# Patient Record
Sex: Female | Born: 1965 | Race: White | Hispanic: No | Marital: Married | Smoking: Current some day smoker
Health system: Southern US, Community
[De-identification: ages and names within clinical notes are randomized; demographics above are authoritative.]

## PROBLEM LIST (undated history)

## (undated) DIAGNOSIS — L409 Psoriasis, unspecified: Secondary | ICD-10-CM

## (undated) DIAGNOSIS — E049 Nontoxic goiter, unspecified: Secondary | ICD-10-CM

## (undated) DIAGNOSIS — T4145XA Adverse effect of unspecified anesthetic, initial encounter: Secondary | ICD-10-CM

## (undated) DIAGNOSIS — T8859XA Other complications of anesthesia, initial encounter: Secondary | ICD-10-CM

## (undated) DIAGNOSIS — E079 Disorder of thyroid, unspecified: Secondary | ICD-10-CM

## (undated) DIAGNOSIS — E039 Hypothyroidism, unspecified: Secondary | ICD-10-CM

## (undated) DIAGNOSIS — E559 Vitamin D deficiency, unspecified: Secondary | ICD-10-CM

## (undated) HISTORY — DX: Disorder of thyroid, unspecified: E07.9

## (undated) HISTORY — PX: BREAST ENHANCEMENT SURGERY: SHX7

## (undated) HISTORY — DX: Vitamin D deficiency, unspecified: E55.9

## (undated) HISTORY — DX: Psoriasis, unspecified: L40.9

## (undated) HISTORY — DX: Nontoxic goiter, unspecified: E04.9

---

## 2004-11-07 ENCOUNTER — Other Ambulatory Visit: Admission: RE | Admit: 2004-11-07 | Discharge: 2004-11-07 | Payer: Self-pay | Admitting: Obstetrics & Gynecology

## 2005-11-09 ENCOUNTER — Other Ambulatory Visit: Admission: RE | Admit: 2005-11-09 | Discharge: 2005-11-09 | Payer: Self-pay | Admitting: Family Medicine

## 2008-08-26 ENCOUNTER — Encounter: Admission: RE | Admit: 2008-08-26 | Discharge: 2008-08-26 | Payer: Self-pay | Admitting: Obstetrics and Gynecology

## 2010-11-28 ENCOUNTER — Other Ambulatory Visit: Payer: Self-pay | Admitting: Obstetrics and Gynecology

## 2010-11-28 DIAGNOSIS — Z1231 Encounter for screening mammogram for malignant neoplasm of breast: Secondary | ICD-10-CM

## 2010-11-28 DIAGNOSIS — Z803 Family history of malignant neoplasm of breast: Secondary | ICD-10-CM

## 2010-12-14 ENCOUNTER — Ambulatory Visit
Admission: RE | Admit: 2010-12-14 | Discharge: 2010-12-14 | Disposition: A | Discharge status: Home / Self Care | Payer: Managed Care, Other (non HMO) | Source: Ambulatory Visit | Attending: Obstetrics and Gynecology | Admitting: Obstetrics and Gynecology

## 2010-12-14 DIAGNOSIS — Z803 Family history of malignant neoplasm of breast: Secondary | ICD-10-CM

## 2010-12-14 DIAGNOSIS — Z1231 Encounter for screening mammogram for malignant neoplasm of breast: Secondary | ICD-10-CM

## 2012-11-11 ENCOUNTER — Other Ambulatory Visit: Payer: Self-pay

## 2012-11-11 DIAGNOSIS — Z1231 Encounter for screening mammogram for malignant neoplasm of breast: Secondary | ICD-10-CM

## 2012-12-26 ENCOUNTER — Ambulatory Visit
Admission: RE | Admit: 2012-12-26 | Discharge: 2012-12-26 | Disposition: A | Discharge status: Home / Self Care | Payer: Managed Care, Other (non HMO) | Source: Ambulatory Visit

## 2012-12-26 DIAGNOSIS — Z1231 Encounter for screening mammogram for malignant neoplasm of breast: Secondary | ICD-10-CM

## 2014-04-06 ENCOUNTER — Other Ambulatory Visit: Payer: Self-pay

## 2014-04-06 DIAGNOSIS — Z1231 Encounter for screening mammogram for malignant neoplasm of breast: Secondary | ICD-10-CM

## 2014-05-04 ENCOUNTER — Ambulatory Visit
Admission: RE | Admit: 2014-05-04 | Discharge: 2014-05-04 | Disposition: A | Discharge status: Home / Self Care | Payer: Managed Care, Other (non HMO) | Source: Ambulatory Visit

## 2014-05-04 ENCOUNTER — Other Ambulatory Visit: Payer: Self-pay

## 2014-05-04 DIAGNOSIS — Z1231 Encounter for screening mammogram for malignant neoplasm of breast: Secondary | ICD-10-CM

## 2015-07-04 ENCOUNTER — Encounter: Payer: Self-pay | Admitting: Internal Medicine

## 2015-07-18 ENCOUNTER — Ambulatory Visit: Payer: Managed Care, Other (non HMO) | Admitting: Internal Medicine

## 2015-08-01 ENCOUNTER — Other Ambulatory Visit: Payer: Self-pay

## 2015-08-01 DIAGNOSIS — Z1231 Encounter for screening mammogram for malignant neoplasm of breast: Secondary | ICD-10-CM

## 2015-08-16 ENCOUNTER — Encounter: Payer: Self-pay | Admitting: Internal Medicine

## 2015-08-16 ENCOUNTER — Ambulatory Visit (INDEPENDENT_AMBULATORY_CARE_PROVIDER_SITE_OTHER): Payer: Managed Care, Other (non HMO) | Admitting: Internal Medicine

## 2015-08-16 VITALS — BP 132/80 | HR 82 | Temp 98.6°F | Resp 12 | Ht 63.5 in | Wt 159.0 lb

## 2015-08-16 DIAGNOSIS — E559 Vitamin D deficiency, unspecified: Secondary | ICD-10-CM | POA: Diagnosis not present

## 2015-08-16 DIAGNOSIS — E040 Nontoxic diffuse goiter: Secondary | ICD-10-CM | POA: Diagnosis not present

## 2015-08-16 DIAGNOSIS — E038 Other specified hypothyroidism: Secondary | ICD-10-CM

## 2015-08-16 DIAGNOSIS — E063 Autoimmune thyroiditis: Secondary | ICD-10-CM | POA: Insufficient documentation

## 2015-08-16 LAB — TSH: TSH: 1.07 u[IU]/mL (ref 0.35–4.50)

## 2015-08-16 LAB — VITAMIN D 25 HYDROXY (VIT D DEFICIENCY, FRACTURES): VITD: 26.8 ng/mL — AB (ref 30.00–100.00)

## 2015-08-16 LAB — T4, FREE: FREE T4: 1.22 ng/dL (ref 0.60–1.60)

## 2015-08-16 MED ORDER — LEVOTHYROXINE SODIUM 125 MCG PO TABS: 125.0000 ug | ORAL_TABLET | Freq: Every day | ORAL | Status: DC

## 2015-08-16 NOTE — Patient Instructions (Signed)
Please stop at the lab.  Please continue the vitamin D 2000 units daily >> try to take it every day.  Continue Levothyroxine 125 mcg daily.  Take the thyroid hormone every day, with water, at least 30 minutes before breakfast, separated by at least 4 hours from: - acid reflux medications - calcium - iron - multivitamins  Please come back for a follow-up appointment in 6 months.

## 2015-08-16 NOTE — Progress Notes (Signed)
Patient ID: Leah Lin, female   DOB: Sep 03, 1965, 50 y.o.   MRN: [id number]   HPI  Leah Lin is a 50 y.o.-year-old female, self-referred for management of vitamin D deficiency and Hashimoto's hypothyroidism. She was previously followed by Dr. Ester Rink, endocrinologist at [facility].  Patient was diagnosed with vitamin D deficiency in ~2010.  Reviewed levels: 08/17/2013: Vitamin D 20.1 10/20/2012: Vitamin D 21.6 09/08/2012: Vitamin D 16.0 09/11/2011: Vitamin D 13.7  The records from Dr. Kalman Shan, patient had negative blood celiac workup in 08/2012.  She was intermittently compliant with her Vit D supplements in the past. Now on vitamin D3 2000 units - was recently off - now 4/7 days.   No kidney stones hx.  Pt. has been dx with hypothyroidism in 2010; this proved to he 2/2 Hashimoto's thyroiditis  She is on Levothyroxine 125 mcg, taken: - fasting - with water - no b'fast  - + coffee + creamer <30 min after the LT4 - no calcium, iron, PPIs, multivitamins   I reviewed pt's thyroid tests - per records from Dr Kalman Shan: 08/17/2013: TSH 1.300 10/20/2012: TSH 0.637 09/08/2012: TSH 0.210 09/11/2011: TSH 0.945 03/05/2011: TSH 1.170 06/16/2010: TSH 1.640  Pt describes: - + weight gain in the past, now stable. Previous UFC normal in 2011. - + fatigue - No cold intolerance, + hot flushes - no depression, anxiety - + constipation - no dry skin - no hair loss  Pt denies feeling nodules in neck, hoarseness, dysphagia/odynophagia, SOB with lying down.  She has a h/o non-nodular goiter. I reviewed her latest thyroid ultrasound report from 10/13/2011: Right lobe: 5.6 x 2.0 x 1.9 cm  Left lobe: 5.0 x 1.7 x 1.6 cm.  Both lobes are relatively hypervascular and heterogeneous in appearance, similar to previous exam (2011). No new discrete nodule suggested.   She has + FH of thyroid disorders in: father and sister (hypothroidism). No FH of thyroid cancer.  No h/o radiation tx  to head or neck. No recent use of iodine supplements. No Biotin.  + skin psoriasis.   ROS: Constitutional: see HPI Eyes: + blurry vision with HAs, no xerophthalmia ENT: no sore throat, no nodules palpated in throat, no dysphagia/odynophagia, no hoarseness Cardiovascular: no CP/SOB/palpitations/leg swelling Respiratory: no cough/SOB Gastrointestinal: no N/V/D/+ C Musculoskeletal: no muscle/joint aches Skin: no rashes Neurological: no tremors/numbness/tingling/dizziness Psychiatric: no depression/anxiety + low libido  Past Medical History  Diagnosis Date  . Thyroid disease     Hashimoto's thyroiditis  . Vitamin D deficiency   . Psoriasis   . Goiter    No past surgical history on file. Social History   Social History  . Marital Status: Married    Spouse Name: N/A  . Number of Children: 2   Occupational History  . homemaker   Social History Main Topics  . Smoking status: Former Research scientist (life sciences), quit in 2013  . Smokeless tobacco: Not on file  . Alcohol Use: Wine, liquor, 3-4x a week  . Drug Use: No   Current Outpatient Prescriptions on File Prior to Visit  Medication Sig Dispense Refill  . levothyroxine (SYNTHROID, LEVOTHROID) 125 MCG tablet Take 125 mcg by mouth. TAKE ONE TABLET DAILY, Sunday - Friday AND 1/2 TABLET ON Saturday, ON AN EMPTY STOMACH.     No current facility-administered medications on file prior to visit.   Allergies  Allergen Reactions  . Tetracyclines & Related Swelling  . Amoxicillin Rash   Family History  Problem Relation Age of Onset  . Hypertension  Mother   . Cancer Mother   . Diabetes Father   . Hypertension Father   . Thyroid disease Father   . Diabetes Sister   . Thyroid disease Sister   . Cancer Sister    PE: BP 132/80 mmHg  Pulse 82  Temp(Src) 98.6 F (37 C) (Oral)  Resp 12  Ht 5' 3.5" (1.613 m)  Wt 159 lb (72.122 kg)  BMI 27.72 kg/m2  SpO2 99% Wt Readings from Last 3 Encounters:  08/16/15 159 lb (72.122 kg)    Constitutional: overweight, in NAD Eyes: PERRLA, EOMI, no exophthalmos ENT: moist mucous membranes, slight symmetric thyromegaly, no cervical lymphadenopathy Cardiovascular: RRR, No MRG Respiratory: CTA B Gastrointestinal: abdomen soft, NT, ND, BS+ Musculoskeletal: no deformities, strength intact in all 4 Skin: moist, warm, no rashes Neurological: no tremor with outstretched hands, DTR normal in all 4  ASSESSMENT: 1. Hashimoto's Hypothyroidism  2. Vit D deficiency  3 goiter.   PLAN:  1. Patient with long-standing hypothyroidism, on levothyroxine therapy. She appears euthyroid. She does appear to have a goiter, but no thyroid nodules, or neck compression symptoms. - We discussed about correct intake of levothyroxine, fasting, with water, separated by at least 30 minutes from breakfast, and separated by more than 4 hours from calcium, iron, multivitamins, acid reflux medications (PPIs). She is taking this correctly, however, she is drinking coffee with creamer less than 30 minutes after taking levothyroxine. I advised her to move this at least 30 minutes after. - will check thyroid tests today: TSH, free T4 - If these are abnormal, she will need to return in 6 weeks for repeat labs - If these are normal, I will see her back in 6 months  2.  Vit D deficiency - Patient with history of vitamin D deficiency, with no checks in the last 2 years - She is taking vitamin D3 2000 units daily, however, she skips many doses, so that she ends up taking it approximately 3 times a week - Advised her to start taking it every day, but will recheck level today and she may need to restart high-dose ergocalciferol weekly.  3. Goiter - Non-compressing, Non-toxic - Non-nodular per the last ultrasound report from 2013. No further repeat ultrasounds needed.  Needs refill of levothyroxine when labs are back.  CC: [place]  Corrington, Delsa Grana., MD  Doyle Askew  PA-C    Office Visit on 08/16/2015  Component Date Value Ref Range Status  . TSH 08/16/2015 1.07  0.35 - 4.50 uIU/mL Final  . Free T4 08/16/2015 1.22  0.60 - 1.60 ng/dL Final  . VITD 08/16/2015 26.80* 30.00 - 100.00 ng/mL Final   TFTs normal >> will continue the same dose of levothyroxine for now. Vitamin D slightly low >> will advise to continue with the 2000 units of vitamin D3, but she needs to remember to take it daily. I will recheck this at next visit.

## 2015-08-17 LAB — THYROID PEROXIDASE ANTIBODY: Thyroperoxidase Ab SerPl-aCnc: >900 IU/mL — ABNORMAL HIGH (ref <9)

## 2015-08-30 ENCOUNTER — Ambulatory Visit: Payer: Managed Care, Other (non HMO)

## 2015-12-06 NOTE — Patient Instructions (Signed)
Your procedure is scheduled on:  Tuesday, December 20, 2015  Enter through the [facility] at:  6:00 AM  Pick up the phone at the desk and dial [phone].  Call this number if you have problems the morning of surgery: [phone].  Remember: Do NOT eat food or drink after:  Midnight Monday, December 19, 2015  Take these medicines the morning of surgery with a SIP OF WATER:  Levothyroxine  Do NOT wear jewelry (body piercing), metal hair clips/bobby pins, make-up, or nail polish. Do NOT wear lotions, powders, or perfumes.  You may wear deodorant. Do NOT shave for 48 hours prior to surgery. Do NOT bring valuables to the hospital. Contacts, dentures, or bridgework may not be worn into surgery.  Leave suitcase in car.  After surgery it may be brought to your room.  For patients admitted to the hospital, checkout time is 11:00 AM the day of discharge.

## 2015-12-07 ENCOUNTER — Encounter (HOSPITAL_COMMUNITY)
Admission: RE | Admit: 2015-12-07 | Discharge: 2015-12-07 | Disposition: A | Discharge status: Home / Self Care | Payer: Managed Care, Other (non HMO) | Source: Ambulatory Visit | Attending: Obstetrics and Gynecology | Admitting: Obstetrics and Gynecology

## 2015-12-07 ENCOUNTER — Other Ambulatory Visit: Payer: Self-pay | Admitting: Obstetrics and Gynecology

## 2015-12-07 ENCOUNTER — Encounter (HOSPITAL_COMMUNITY): Payer: Self-pay

## 2015-12-07 DIAGNOSIS — Z01812 Encounter for preprocedural laboratory examination: Secondary | ICD-10-CM | POA: Insufficient documentation

## 2015-12-07 HISTORY — DX: Adverse effect of unspecified anesthetic, initial encounter: T41.45XA

## 2015-12-07 HISTORY — DX: Other complications of anesthesia, initial encounter: T88.59XA

## 2015-12-07 HISTORY — DX: Hypothyroidism, unspecified: E03.9

## 2015-12-07 LAB — CBC
HCT: 40.4 % (ref 36.0–46.0)
HEMOGLOBIN: 13.8 g/dL (ref 12.0–15.0)
MCH: 31.1 pg (ref 26.0–34.0)
MCHC: 34.2 g/dL (ref 30.0–36.0)
MCV: 91 fL (ref 78.0–100.0)
Platelets: 321 10*3/uL (ref 150–400)
RBC: 4.44 MIL/uL (ref 3.87–5.11)
RDW: 13.8 % (ref 11.5–15.5)
WBC: 4.8 10*3/uL (ref 4.0–10.5)

## 2015-12-07 LAB — ABO/RH: ABO/RH(D): B POS

## 2015-12-19 ENCOUNTER — Encounter (HOSPITAL_COMMUNITY): Payer: Self-pay | Admitting: Anesthesiology

## 2015-12-19 MED ORDER — CIPROFLOXACIN IN D5W 400 MG/200ML IV SOLN
400.0000 mg | INTRAVENOUS | Status: AC
  Administered 2015-12-20: 400 mg via INTRAVENOUS
  Filled 2015-12-19: qty 200

## 2015-12-19 MED ORDER — METRONIDAZOLE IN NACL 5-0.79 MG/ML-% IV SOLN
500.0000 mg | INTRAVENOUS | Status: AC
  Administered 2015-12-20: 500 mg via INTRAVENOUS
  Filled 2015-12-19: qty 100

## 2015-12-19 NOTE — H&P (Signed)
50 y.o. yo complains of uterine prolapse, SUI mild, cystocele and rectocele.  She is also having irritation of the vulva that did not get better with desitin.  Patient is not on Hormone Replacement Therapy., She reports menopausal symptoms.  No bleeding at all. Some irritation on outside. No more periods for 9 months. Some pmp sx but not bad. Now prolapse feels like she needs to push back up. No interest in SA since it bothers her that it is there. Feels like something right at entrance. Although her SUI is mild, it may worsen significantly with A repair since her cystocele is large and she had urethral hypermobility. She also c/o vaginal d/c, itching and irritation, did not try Monistat because that caused burning.  She used desitin and metrogel but vulvar AWL is still there.   Past Medical History:  Diagnosis Date  . Complication of anesthesia    hard time waking up after anesthesia  . Goiter   . Hypothyroidism   . Psoriasis   . Thyroid disease    Hashimoto's thyroiditis  . Vitamin D deficiency    Past Surgical History:  Procedure Laterality Date  . BREAST ENHANCEMENT SURGERY      Social History   Social History  . Marital status: Married    Spouse name: N/A  . Number of children: N/A  . Years of education: N/A   Occupational History  . Not on file.   Social History Main Topics  . Smoking status: Current Some Day Smoker    Packs/day: 1.00    Years: 20.00    Types: E-cigarettes, Cigarettes  . Smokeless tobacco: Never Used  . Alcohol use 0.0 oz/week  . Drug use: No  . Sexual activity: Yes    Birth control/ protection: Post-menopausal   Other Topics Concern  . Not on file   Social History Narrative  . No narrative on file    No current facility-administered medications on file prior to encounter.    Current Outpatient Prescriptions on File Prior to Encounter  Medication Sig Dispense Refill  . Cholecalciferol (VITAMIN D3 SUPER STRENGTH) 2000 units CAPS Take 1 capsule  by mouth daily.    Marland Kitchen levothyroxine (SYNTHROID, LEVOTHROID) 125 MCG tablet Take 1 tablet (125 mcg total) by mouth daily before breakfast. 90 tablet 3    Allergies  Allergen Reactions  . Tetracyclines & Related Swelling  . Amoxicillin Rash    Has patient had a PCN reaction causing immediate rash, facial/tongue/throat swelling, SOB or lightheadedness with hypotension: No Has patient had a PCN reaction causing severe rash involving mucus membranes or skin necrosis: No Has patient had a PCN reaction that required hospitalization No Has patient had a PCN reaction occurring within the last 10 years: No If all of the above answers are "NO", then may proceed with Cephalosporin use.     @VITALS2 @  Lungs: clear to ascultation Cor:  RRR Abdomen:  soft, nontender, nondistended. 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  Patient  will receive preop antibiotics and SCDs during the operation.     Samira Acero A

## 2015-12-20 ENCOUNTER — Ambulatory Visit (HOSPITAL_COMMUNITY): Payer: Managed Care, Other (non HMO) | Admitting: Anesthesiology

## 2015-12-20 ENCOUNTER — Encounter (HOSPITAL_COMMUNITY)
Admission: RE | Disposition: A | Discharge status: Home / Self Care | Payer: Self-pay | Source: Ambulatory Visit | Attending: Obstetrics and Gynecology

## 2015-12-20 ENCOUNTER — Encounter (HOSPITAL_COMMUNITY): Payer: Self-pay | Admitting: *Deleted

## 2015-12-20 ENCOUNTER — Ambulatory Visit (HOSPITAL_COMMUNITY)
Admission: RE | Admit: 2015-12-20 | Discharge: 2015-12-22 | Disposition: A | Discharge status: Home / Self Care | Payer: Managed Care, Other (non HMO) | Source: Ambulatory Visit | Attending: Obstetrics and Gynecology | Admitting: Obstetrics and Gynecology

## 2015-12-20 DIAGNOSIS — N99821 Postprocedural hemorrhage and hematoma of a genitourinary system organ or structure following other procedure: Secondary | ICD-10-CM | POA: Insufficient documentation

## 2015-12-20 DIAGNOSIS — N814 Uterovaginal prolapse, unspecified: Secondary | ICD-10-CM | POA: Diagnosis not present

## 2015-12-20 DIAGNOSIS — E063 Autoimmune thyroiditis: Secondary | ICD-10-CM | POA: Diagnosis not present

## 2015-12-20 DIAGNOSIS — N393 Stress incontinence (female) (male): Secondary | ICD-10-CM | POA: Diagnosis not present

## 2015-12-20 DIAGNOSIS — N800 Endometriosis of uterus: Secondary | ICD-10-CM | POA: Insufficient documentation

## 2015-12-20 DIAGNOSIS — F17210 Nicotine dependence, cigarettes, uncomplicated: Secondary | ICD-10-CM | POA: Diagnosis not present

## 2015-12-20 DIAGNOSIS — L900 Lichen sclerosus et atrophicus: Secondary | ICD-10-CM | POA: Insufficient documentation

## 2015-12-20 DIAGNOSIS — Z880 Allergy status to penicillin: Secondary | ICD-10-CM | POA: Diagnosis not present

## 2015-12-20 DIAGNOSIS — Z9889 Other specified postprocedural states: Secondary | ICD-10-CM

## 2015-12-20 DIAGNOSIS — D251 Intramural leiomyoma of uterus: Secondary | ICD-10-CM | POA: Diagnosis not present

## 2015-12-20 DIAGNOSIS — Z09 Encounter for follow-up examination after completed treatment for conditions other than malignant neoplasm: Secondary | ICD-10-CM

## 2015-12-20 HISTORY — PX: ANTERIOR AND POSTERIOR REPAIR: SHX5121

## 2015-12-20 HISTORY — PX: BLADDER SUSPENSION: SHX72

## 2015-12-20 HISTORY — PX: CYSTOSCOPY: SHX5120

## 2015-12-20 HISTORY — PX: VAGINAL HYSTERECTOMY: SHX2639

## 2015-12-20 LAB — PREGNANCY, URINE: Preg Test, Ur: NEGATIVE

## 2015-12-20 SURGERY — HYSTERECTOMY, VAGINAL
Anesthesia: General | Site: Vagina

## 2015-12-20 MED ORDER — MENTHOL 3 MG MT LOZG
1.0000 | LOZENGE | OROMUCOSAL | Status: DC | PRN
  Administered 2015-12-20: 3 mg via ORAL
  Filled 2015-12-20: qty 9

## 2015-12-20 MED ORDER — ROCURONIUM BROMIDE 100 MG/10ML IV SOLN
INTRAVENOUS | Status: AC
  Filled 2015-12-20: qty 1

## 2015-12-20 MED ORDER — MIDAZOLAM HCL 2 MG/2ML IJ SOLN
INTRAMUSCULAR | Status: DC | PRN
  Administered 2015-12-20: 2 mg via INTRAVENOUS

## 2015-12-20 MED ORDER — ESTRADIOL 0.1 MG/GM VA CREA
TOPICAL_CREAM | VAGINAL | Status: AC
  Filled 2015-12-20: qty 42.5

## 2015-12-20 MED ORDER — ESTRADIOL 0.1 MG/GM VA CREA
TOPICAL_CREAM | VAGINAL | Status: DC | PRN
  Administered 2015-12-20: 1 via VAGINAL

## 2015-12-20 MED ORDER — LIDOCAINE HCL (CARDIAC) 20 MG/ML IV SOLN
INTRAVENOUS | Status: DC | PRN
  Administered 2015-12-20: 80 mg via INTRAVENOUS

## 2015-12-20 MED ORDER — OXYCODONE-ACETAMINOPHEN 5-325 MG PO TABS
1.0000 | ORAL_TABLET | ORAL | Status: DC | PRN
  Administered 2015-12-20 – 2015-12-21 (×4): 2 via ORAL
  Filled 2015-12-20 (×4): qty 2

## 2015-12-20 MED ORDER — SCOPOLAMINE 1 MG/3DAYS TD PT72
MEDICATED_PATCH | TRANSDERMAL | Status: AC
  Administered 2015-12-20: 1.5 mg via TRANSDERMAL
  Filled 2015-12-20: qty 1

## 2015-12-20 MED ORDER — HYDROMORPHONE HCL 1 MG/ML IJ SOLN
INTRAMUSCULAR | Status: AC
  Filled 2015-12-20: qty 1

## 2015-12-20 MED ORDER — LIDOCAINE-EPINEPHRINE (PF) 1 %-1:200000 IJ SOLN
INTRAMUSCULAR | Status: AC
  Filled 2015-12-20: qty 30

## 2015-12-20 MED ORDER — PROPOFOL 10 MG/ML IV BOLUS
INTRAVENOUS | Status: DC | PRN
  Administered 2015-12-20: 200 mg via INTRAVENOUS
  Administered 2015-12-20: 50 mg via INTRAVENOUS

## 2015-12-20 MED ORDER — KETOROLAC TROMETHAMINE 30 MG/ML IJ SOLN: 30.0000 mg | Freq: Once | INTRAMUSCULAR | Status: DC

## 2015-12-20 MED ORDER — KETOROLAC TROMETHAMINE 30 MG/ML IJ SOLN
INTRAMUSCULAR | Status: AC
  Filled 2015-12-20: qty 1

## 2015-12-20 MED ORDER — LIDOCAINE-EPINEPHRINE 0.5 %-1:200000 IJ SOLN
INTRAMUSCULAR | Status: DC | PRN
  Administered 2015-12-20: 23 mL

## 2015-12-20 MED ORDER — FENTANYL CITRATE (PF) 100 MCG/2ML IJ SOLN
INTRAMUSCULAR | Status: AC
  Filled 2015-12-20: qty 2

## 2015-12-20 MED ORDER — ONDANSETRON HCL 4 MG/2ML IJ SOLN
INTRAMUSCULAR | Status: DC | PRN
  Administered 2015-12-20: 4 mg via INTRAVENOUS

## 2015-12-20 MED ORDER — GELATIN ABSORBABLE 12-7 MM EX MISC
CUTANEOUS | Status: DC | PRN
  Administered 2015-12-20: 1

## 2015-12-20 MED ORDER — DEXAMETHASONE SODIUM PHOSPHATE 4 MG/ML IJ SOLN
INTRAMUSCULAR | Status: AC
  Filled 2015-12-20: qty 1

## 2015-12-20 MED ORDER — PROPOFOL 10 MG/ML IV BOLUS
INTRAVENOUS | Status: AC
  Filled 2015-12-20: qty 20

## 2015-12-20 MED ORDER — HYDROMORPHONE HCL 1 MG/ML IJ SOLN
INTRAMUSCULAR | Status: DC | PRN
  Administered 2015-12-20: 0.5 mg via INTRAVENOUS
  Administered 2015-12-20: 1 mg via INTRAVENOUS
  Administered 2015-12-20: 0.5 mg via INTRAVENOUS

## 2015-12-20 MED ORDER — LIDOCAINE-EPINEPHRINE 0.5 %-1:200000 IJ SOLN
INTRAMUSCULAR | Status: AC
  Filled 2015-12-20: qty 1

## 2015-12-20 MED ORDER — LACTATED RINGERS IV SOLN
INTRAVENOUS | Status: DC
Start: 2015-12-20 — End: 2015-12-20
  Administered 2015-12-20 (×3): via INTRAVENOUS

## 2015-12-20 MED ORDER — ONDANSETRON HCL 4 MG PO TABS: 4.0000 mg | ORAL_TABLET | Freq: Four times a day (QID) | ORAL | Status: DC | PRN

## 2015-12-20 MED ORDER — IBUPROFEN 800 MG PO TABS
800.0000 mg | ORAL_TABLET | Freq: Three times a day (TID) | ORAL | Status: DC | PRN
  Administered 2015-12-21: 800 mg via ORAL
  Filled 2015-12-20: qty 1

## 2015-12-20 MED ORDER — LIDOCAINE HCL (CARDIAC) 20 MG/ML IV SOLN
INTRAVENOUS | Status: AC
  Filled 2015-12-20: qty 5

## 2015-12-20 MED ORDER — HYDROMORPHONE HCL 1 MG/ML IJ SOLN
0.2500 mg | INTRAMUSCULAR | Status: DC | PRN
Start: 2015-12-20 — End: 2015-12-20
  Administered 2015-12-20: 0.25 mg via INTRAVENOUS

## 2015-12-20 MED ORDER — SCOPOLAMINE 1 MG/3DAYS TD PT72
1.0000 | MEDICATED_PATCH | Freq: Once | TRANSDERMAL | Status: DC
  Administered 2015-12-20: 1.5 mg via TRANSDERMAL

## 2015-12-20 MED ORDER — FENTANYL CITRATE (PF) 100 MCG/2ML IJ SOLN
INTRAMUSCULAR | Status: DC | PRN
  Administered 2015-12-20 (×2): 50 ug via INTRAVENOUS
  Administered 2015-12-20: 100 ug via INTRAVENOUS
  Administered 2015-12-20: 50 ug via INTRAVENOUS

## 2015-12-20 MED ORDER — STERILE WATER FOR IRRIGATION IR SOLN
Status: DC | PRN
  Administered 2015-12-20: 1000 mL

## 2015-12-20 MED ORDER — PROMETHAZINE HCL 25 MG/ML IJ SOLN
6.2500 mg | INTRAMUSCULAR | Status: DC | PRN
Start: 2015-12-20 — End: 2015-12-20

## 2015-12-20 MED ORDER — KETOROLAC TROMETHAMINE 30 MG/ML IJ SOLN: 30.0000 mg | Freq: Four times a day (QID) | INTRAMUSCULAR | Status: DC

## 2015-12-20 MED ORDER — LEVOTHYROXINE SODIUM 125 MCG PO TABS
125.0000 ug | ORAL_TABLET | Freq: Every day | ORAL | Status: DC
  Administered 2015-12-21: 125 ug via ORAL
  Filled 2015-12-20 (×2): qty 1

## 2015-12-20 MED ORDER — FENTANYL CITRATE (PF) 250 MCG/5ML IJ SOLN
INTRAMUSCULAR | Status: AC
  Filled 2015-12-20: qty 5

## 2015-12-20 MED ORDER — MIDAZOLAM HCL 2 MG/2ML IJ SOLN
INTRAMUSCULAR | Status: AC
  Filled 2015-12-20: qty 2

## 2015-12-20 MED ORDER — ONDANSETRON HCL 4 MG/2ML IJ SOLN
INTRAMUSCULAR | Status: AC
  Filled 2015-12-20: qty 2

## 2015-12-20 MED ORDER — DEXAMETHASONE SODIUM PHOSPHATE 10 MG/ML IJ SOLN
INTRAMUSCULAR | Status: DC | PRN
  Administered 2015-12-20: 4 mg via INTRAVENOUS

## 2015-12-20 MED ORDER — ONDANSETRON HCL 4 MG/2ML IJ SOLN: 4.0000 mg | Freq: Four times a day (QID) | INTRAMUSCULAR | Status: DC | PRN

## 2015-12-20 MED ORDER — KETOROLAC TROMETHAMINE 30 MG/ML IJ SOLN
INTRAMUSCULAR | Status: DC | PRN
  Administered 2015-12-20: 30 mg via INTRAVENOUS

## 2015-12-20 MED ORDER — MEPERIDINE HCL 25 MG/ML IJ SOLN: 6.2500 mg | INTRAMUSCULAR | Status: DC | PRN

## 2015-12-20 SURGICAL SUPPLY — 37 items
CANISTER SUCT 3000ML (MISCELLANEOUS) ×5 IMPLANT
CATH ROBINSON RED A/P 16FR (CATHETERS) ×5 IMPLANT
CLOTH BEACON ORANGE TIMEOUT ST (SAFETY) ×5 IMPLANT
CONT PATH 16OZ SNAP LID 3702 (MISCELLANEOUS) ×2 IMPLANT
DECANTER SPIKE VIAL GLASS SM (MISCELLANEOUS) IMPLANT
FORMULA 20CAL 3 OZ MEAD (FORMULA) ×3 IMPLANT
GAUZE PACKING 1 X5 YD ST (GAUZE/BANDAGES/DRESSINGS) ×3 IMPLANT
GAUZE PACKING 2X5 YD STRL (GAUZE/BANDAGES/DRESSINGS) ×3 IMPLANT
GLOVE BIO SURGEON STRL SZ7 (GLOVE) ×5 IMPLANT
GLOVE BIOGEL PI IND STRL 6.5 (GLOVE) ×3 IMPLANT
GLOVE BIOGEL PI IND STRL 7.0 (GLOVE) ×3 IMPLANT
GLOVE BIOGEL PI INDICATOR 6.5 (GLOVE) ×2
GLOVE BIOGEL PI INDICATOR 7.0 (GLOVE) ×2
GOWN STRL REUS W/TWL LRG LVL3 (GOWN DISPOSABLE) ×20 IMPLANT
LIQUID BAND (GAUZE/BANDAGES/DRESSINGS) ×3 IMPLANT
NDL SPNL 22GX3.5 QUINCKE BK (NEEDLE) IMPLANT
NEEDLE HYPO 22GX1.5 SAFETY (NEEDLE) ×5 IMPLANT
NEEDLE SPNL 22GX3.5 QUINCKE BK (NEEDLE) ×5 IMPLANT
NS IRRIG 1000ML POUR BTL (IV SOLUTION) ×5 IMPLANT
PACK VAGINAL WOMENS (CUSTOM PROCEDURE TRAY) ×5 IMPLANT
PAD OB MATERNITY 4.3X12.25 (PERSONAL CARE ITEMS) ×5 IMPLANT
SET CYSTO W/LG BORE CLAMP LF (SET/KITS/TRAYS/PACK) ×5 IMPLANT
SLING TRANS VAGINAL TAPE (Sling) ×2 IMPLANT
SLING UTERINE/ABD GYNECARE TVT (Sling) IMPLANT
SUT SILK 3 0 SH CR/8 (SUTURE) IMPLANT
SUT VIC AB 0 CT1 18XCR BRD8 (SUTURE) ×9 IMPLANT
SUT VIC AB 0 CT1 8-18 (SUTURE) ×15
SUT VIC AB 2-0 CT1 (SUTURE) ×10 IMPLANT
SUT VIC AB 2-0 CT1 27 (SUTURE) ×10
SUT VIC AB 2-0 CT1 TAPERPNT 27 (SUTURE) ×10 IMPLANT
SUT VIC AB 2-0 SH 27 (SUTURE) ×5
SUT VIC AB 2-0 SH 27XBRD (SUTURE) ×2 IMPLANT
SUT VICRYL 0 TIES 12 18 (SUTURE) ×5 IMPLANT
SYRINGE TOOMEY DISP (SYRINGE) ×2 IMPLANT
TOWEL OR 17X24 6PK STRL BLUE (TOWEL DISPOSABLE) ×10 IMPLANT
TRAY FOLEY CATH SILVER 14FR (SET/KITS/TRAYS/PACK) ×5 IMPLANT
WATER STERILE IRR 1000ML POUR (IV SOLUTION) ×3 IMPLANT

## 2015-12-20 NOTE — Anesthesia Postprocedure Evaluation (Signed)
Anesthesia Post Note  Patient: Leah Lin  Procedure(s) Performed: Procedure(s) (LRB): HYSTERECTOMY VAGINAL WITH RIGHT SALPINGECTOMY (N/A) ANTERIOR (CYSTOCELE) AND POSTERIOR REPAIR (RECTOCELE),PERITONEAL BIOPSY (N/A) CYSTOSCOPY (N/A) TRANSVAGINAL TAPE (TVT) PROCEDURE  Patient location during evaluation: PACU Anesthesia Type: General Level of consciousness: awake Pain management: pain level controlled Vital Signs Assessment: post-procedure vital signs reviewed and stable Respiratory status: spontaneous breathing Cardiovascular status: stable Postop Assessment: no signs of nausea or vomiting Anesthetic complications: no     Last Vitals:  Vitals:   12/20/15 1015 12/20/15 1030  BP: (!) 124/97 125/75  Pulse: 90 88  Resp: 15 13  Temp:      Last Pain:  Vitals:   12/20/15 1045  TempSrc:   PainSc: 2    Pain Goal: Patients Stated Pain Goal: 2 (12/20/15 0615)               [place]

## 2015-12-20 NOTE — Anesthesia Preprocedure Evaluation (Addendum)
Anesthesia Evaluation  Patient identified by MRN, date of birth, ID band  Reviewed: Allergy & Precautions, H&P , NPO status , Patient's Chart, lab work & pertinent test results, reviewed documented beta blocker date and time   Airway Mallampati: I  TM Distance: >3 FB Neck ROM: full    Dental  (+) Teeth Intact, Loose, Dental Advisory Given,    Pulmonary neg pulmonary ROS, Current Smoker,    Pulmonary exam normal        Cardiovascular negative cardio ROS Normal cardiovascular exam     Neuro/Psych negative neurological ROS  negative psych ROS   GI/Hepatic negative GI ROS, Neg liver ROS,   Endo/Other    Renal/GU negative Renal ROS     Musculoskeletal   Abdominal Normal abdominal exam  (+)   Peds  Hematology negative hematology ROS (+)   Anesthesia Other Findings   Reproductive/Obstetrics negative OB ROS                            Anesthesia Physical Anesthesia Plan  ASA: II  Anesthesia Plan: General   Post-op Pain Management:    Induction: Intravenous  Airway Management Planned: LMA  Additional Equipment:   Intra-op Plan:   Post-operative Plan:   Informed Consent: I have reviewed the patients History and Physical, chart, labs and discussed the procedure including the risks, benefits and alternatives for the proposed anesthesia with the patient or authorized representative who has indicated his/her understanding and acceptance.   Dental Advisory Given  Plan Discussed with: CRNA and Surgeon  Anesthesia Plan Comments:         Anesthesia Quick Evaluation

## 2015-12-20 NOTE — Op Note (Signed)
12/20/2015  9:28 AM  PATIENT:  Leah Lin  50 y.o. female  PRE-OPERATIVE DIAGNOSIS:  PROLAPSE, SUI, CYSTocele, Rectocele, vaginal AWL  POST-OPERATIVE DIAGNOSIS:  PROLAPSE, SUI, CYSTOCELE, same  PROCEDURE:  Procedure(s): HYSTERECTOMY VAGINAL (N/A) ANTERIOR (CYSTOCELE) AND POSTERIOR REPAIR (RECTOCELE) (N/A) CYSTOSCOPY (N/A)  SURGEON:  Surgeon(s) and Role:    * Bobbye Charleston, MD - Primary    * Alden Hipp, MD - Assisting  ANESTHESIA:   general  EBL:  Total I/O In: 2300 [I.V.:2300] Out: 150 [Blood:150]  LOCAL MEDICATIONS USED:  MARCAINE, gelfoam  SPECIMEN:  Source of Specimen:  uterus, cervix, R tube, perineal biopsy  DISPOSITION OF SPECIMEN:  PATHOLOGY  COUNTS:  YES  TOURNIQUET:  * No tourniquets in log *  DICTATION: .Note written in EPIC  PLAN OF CARE: Admit for overnight observation  PATIENT DISPOSITION:  PACU - hemodynamically stable.   Delay start of Pharmacological VTE agent (>24hrs) due to surgical blood loss or risk of bleeding: not applicable  Meds:  Similac in bladder, lidocaine with epi. Gel foam and estrace.  Findings: cystocele to 0, 7 week size uterus and normal ovaries.  Technique:  Meds:  Similac in bladder, lidocaine with epi, and estrace.  Findings: cystocele to 0, rectocel to -1, 7 week size uterus and normal ovaries.  Prolapse to -3.  Intact bladder and NO NEEDLES in bladder; good spill from bilateral ureteral orifaces.  Technique:  After general anesthesia was achieved, the patient was prepped and draped in a sterile fashion.  The bladder was emptied with a red rubber catheter and 60 cc of Similac was placed in the bladder. The cervix was grasped with a pair of Lahey clamps and injected circumferentially with 0.5% marcaine with epi. A circumferential incision was made around the cervix with the scalpel at the level of the reflection of the vagina onto the cervix and the posterior cul-de-sac was entered into with Mayo scissors. The long  billed duckbill retractor was then placed and the bladder was removed off the cervix carefully with sharp dissection with the Metzenbaums. The the uterosacrals were grasped with a pair heney clamps on either side and secured with a Heaney stitch of 0 Vicryl. Cardinal ligament was then divided with alternating successive bites of the Heaney clamp followed by incision with the Mayo scissors and secured with stitches of 0 Vicryl at the level of the cornua Heaneys were placed bilaterally around the entire pedicle and the uterus was able to be amputated.  The pedicles were secured with a free tie of 0 vicryl followed by a stitch of 0 vicryl.  The R tube was successively brought down with a babcock and removed with mayos and secured with a stitch.  The L tube was unable to be brought down at all and was left in place.  Once hemostasis was achieved the peritoneum was closed in a pursestring fashion including the bilateral uterosacrals and posteriorly in and out of the vagina and a partial Halbans culdoplasty.  The stitch was pulled taut closing the peritoneum and pulling the uterosacrals together through the modified Hall bands and through the vagina. The Foley was placed at this point.  The anterior vaginal mucosa was then entered into in the midline with the help of Alice's after being injected with 0.5% marcaine with epi with the scalpel. The vaginal mucosa was then reflected off of the vesicouterine fascia with careful sharp dissection with the Metzenbaums until the pelvic floor could feel be felt underneath the pubic bone. 2 small  stab incisions are made on 2 cm either side of the midline just above the pubic bone. The abdominal needles were placed carefully through the pelvic floor and out the vagina. The Foley was removed and the cystoscope placed inside the bladder; there were no needles in the bladder, there was good spillage of indigo carmine from the bilateral ureteral orifices and the trigone and urethra were  clear.  The foley was replaced. The abdominal needles were attached the vaginal needles and the tape pulled through and cut into place. A Claiborne Billings was used to ensure that the there was no tension placed on the tape. Once I was satisfied that the sling was in the correct place and at no tension, the anterior repair was done with 2 mattress stitches of 0 Vicryl. Hemostasis was achieved with gel foam and several stitches of 2-0 Vicryl to secure a pumper. The vaginal mucosa was trimmed and closed with a running locked stitch of 2-0 Vicryl.  Attention was then turned to the perineum which had some AWL just under entrance. The perineum was grasped with allises  And a triangle of tissue was removed and sent to path.  The midline was grasped all the way to the top of the rectocele with allises.  Marcaine was injected submucosally and then incised in the midline vertically.  The mucosa was then retracted off the rectovaginal fascia with sharp and blunt dissection.  The rectum remained intact throughout the procedure and was checked with a gloved finger several times.  Two mattress stitches were then used to close the rectovaginal fascia over the defect.  The vaginal mucosa was then trimmed and then closed with a running lock stitch of 2-0 vicryl.    The cuff was then closed with interrupted figure-of-eight stitches of 0 Vicryl. One additional look in the bladder with cysto was done to ensure that the extra stitches for hemostasis under the cystocele did not go through or inhibit ureters.  A vaginal pack was placed inside the vagina with Estrace cream and the patient tolerated the procedure was returned to recovery in stable condition.   Patrici Minnis A

## 2015-12-20 NOTE — Transfer of Care (Signed)
Immediate Anesthesia Transfer of Care Note  Patient: Leah Lin  Procedure(s) Performed: Procedure(s): HYSTERECTOMY VAGINAL WITH RIGHT SALPINGECTOMY (N/A) ANTERIOR (CYSTOCELE) AND POSTERIOR REPAIR (RECTOCELE),PERITONEAL BIOPSY (N/A) CYSTOSCOPY (N/A) TRANSVAGINAL TAPE (TVT) PROCEDURE  Patient Location: PACU  Anesthesia Type:General  Level of Consciousness: awake, alert , oriented and patient cooperative  Airway & Oxygen Therapy: Patient Spontanous Breathing and Patient connected to nasal cannula oxygen  Post-op Assessment: Report given to RN and Post -op Vital signs reviewed and stable  Post vital signs: Reviewed and stable  Last Vitals:  Vitals:   12/20/15 0615 12/20/15 0645  BP: (!) 160/115 (!) 164/104  Pulse: 88   Resp: 18   Temp: 36.3 C     Last Pain:  Vitals:   12/20/15 0615  TempSrc: Oral      Patients Stated Pain Goal: 2 ([id number])  Complications: No apparent anesthesia complications

## 2015-12-20 NOTE — Anesthesia Procedure Notes (Addendum)
Procedure Name: LMA Insertion Date/Time: 12/20/2015 7:28 AM Performed by: Raenette Rover Pre-anesthesia Checklist: Patient identified, Emergency Drugs available, Suction available and Patient being monitored Patient Re-evaluated:Patient Re-evaluated prior to inductionOxygen Delivery Method: Circle system utilized Preoxygenation: Pre-oxygenation with 100% oxygen Intubation Type: IV induction Ventilation: Mask ventilation without difficulty LMA: LMA inserted LMA Size: 4.0 Number of attempts: 1 Placement Confirmation: positive ETCO2,  CO2 detector and breath sounds checked- equal and bilateral Tube secured with: Tape Dental Injury: Teeth and Oropharynx as per pre-operative assessment

## 2015-12-20 NOTE — Addendum Note (Signed)
Addendum  created 12/20/15 1353 by Riki Sheer, CRNA   Sign clinical note

## 2015-12-20 NOTE — Anesthesia Postprocedure Evaluation (Signed)
Anesthesia Post Note  Patient: Leah Lin  Procedure(s) Performed: Procedure(s) (LRB): HYSTERECTOMY VAGINAL WITH RIGHT SALPINGECTOMY (N/A) ANTERIOR (CYSTOCELE) AND POSTERIOR REPAIR (RECTOCELE),PERITONEAL BIOPSY (N/A) CYSTOSCOPY (N/A) TRANSVAGINAL TAPE (TVT) PROCEDURE  Patient location during evaluation: [facility] Anesthesia Type: General Level of consciousness: awake and alert Pain management: pain level controlled Vital Signs Assessment: post-procedure vital signs reviewed and stable Respiratory status: spontaneous breathing, nonlabored ventilation, respiratory function stable and patient connected to nasal cannula oxygen Cardiovascular status: blood pressure returned to baseline and stable Postop Assessment: no signs of nausea or vomiting Anesthetic complications: no     Last Vitals:  Vitals:   12/20/15 1112 12/20/15 1205  BP: 129/89 125/88  Pulse: 86 87  Resp: 19 18  Temp: 36.9 C 36.5 C    Last Pain:  Vitals:   12/20/15 1210  TempSrc:   PainSc: 2    Pain Goal: Patients Stated Pain Goal: 2 (12/20/15 0615)               Riki Sheer

## 2015-12-20 NOTE — Progress Notes (Signed)
There has been no change in the patients history, status or exam since the history and physical.  Vitals:   12/20/15 0615 12/20/15 0645  BP: (!) 160/115 (!) 164/104  Pulse: 88   Resp: 18   Temp: 97.4 F (36.3 C)   TempSrc: Oral   SpO2: 98%     Lab Results  Component Value Date   WBC 4.8 12/07/2015   HGB 13.8 12/07/2015   HCT 40.4 12/07/2015   MCV 91.0 12/07/2015   PLT 321 12/07/2015    Violetta Lavalle A

## 2015-12-20 NOTE — Brief Op Note (Signed)
12/20/2015  9:28 AM  PATIENT:  Leah Lin  50 y.o. female  PRE-OPERATIVE DIAGNOSIS:  PROLAPSE, SUI, CYSTocele, Rectocele, vaginal AWL  POST-OPERATIVE DIAGNOSIS:  PROLAPSE, SUI, CYSTOCELE, same  PROCEDURE:  Procedure(s): HYSTERECTOMY VAGINAL (N/A) ANTERIOR (CYSTOCELE) AND POSTERIOR REPAIR (RECTOCELE) (N/A) CYSTOSCOPY (N/A)  SURGEON:  Surgeon(s) and Role:    * Bobbye Charleston, MD - Primary    * Alden Hipp, MD - Assisting  ANESTHESIA:   general  EBL:  Total I/O In: 2300 [I.V.:2300] Out: 150 [Blood:150]  LOCAL MEDICATIONS USED:  MARCAINE, gelfoam  SPECIMEN:  Source of Specimen:  uterus, cervix, R tube, perineal biopsy  DISPOSITION OF SPECIMEN:  PATHOLOGY  COUNTS:  YES  TOURNIQUET:  * No tourniquets in log *  DICTATION: .Note written in EPIC  PLAN OF CARE: Admit for overnight observation  PATIENT DISPOSITION:  PACU - hemodynamically stable.   Delay start of Pharmacological VTE agent (>24hrs) due to surgical blood loss or risk of bleeding: not applicable  Meds:  Similac in bladder, lidocaine with epi. Gel foam and estrace.  Findings: cystocele to 0, 7 week size uterus and normal ovaries.  Technique:  Meds:  Similac in bladder, lidocaine with epi, and estrace.  Findings: cystocele to 0, rectocel to -1, 7 week size uterus and normal ovaries.  Prolapse to -3.  Intact bladder and NO NEEDLES in bladder; good spill from bilateral ureteral orifaces.  Technique:  After general anesthesia was achieved, the patient was prepped and draped in a sterile fashion.  The bladder was emptied with a red rubber catheter and 60 cc of Similac was placed in the bladder. The cervix was grasped with a pair of Lahey clamps and injected circumferentially with 0.5% marcaine with epi. A circumferential incision was made around the cervix with the scalpel at the level of the reflection of the vagina onto the cervix and the posterior cul-de-sac was entered into with Mayo scissors. The long  billed duckbill retractor was then placed and the bladder was removed off the cervix carefully with sharp dissection with the Metzenbaums. The the uterosacrals were grasped with a pair heney clamps on either side and secured with a Heaney stitch of 0 Vicryl. Cardinal ligament was then divided with alternating successive bites of the Heaney clamp followed by incision with the Mayo scissors and secured with stitches of 0 Vicryl at the level of the cornua Heaneys were placed bilaterally around the entire pedicle and the uterus was able to be amputated.  The pedicles were secured with a free tie of 0 vicryl followed by a stitch of 0 vicryl.  The R tube was successively brought down with a babcock and removed with mayos and secured with a stitch.  The L tube was unable to be brought down at all and was left in place.  Once hemostasis was achieved the peritoneum was closed in a pursestring fashion including the bilateral uterosacrals and posteriorly in and out of the vagina and a partial Halbans culdoplasty.  The stitch was pulled taut closing the peritoneum and pulling the uterosacrals together through the modified Hall bands and through the vagina. The Foley was placed at this point.  The anterior vaginal mucosa was then entered into in the midline with the help of Alice's after being injected with 0.5% marcaine with epi with the scalpel. The vaginal mucosa was then reflected off of the vesicouterine fascia with careful sharp dissection with the Metzenbaums until the pelvic floor could feel be felt underneath the pubic bone. 2 small  stab incisions are made on 2 cm either side of the midline just above the pubic bone. The abdominal needles were placed carefully through the pelvic floor and out the vagina. The Foley was removed and the cystoscope placed inside the bladder; there were no needles in the bladder, there was good spillage of indigo carmine from the bilateral ureteral orifices and the trigone and urethra were  clear.  The foley was replaced. The abdominal needles were attached the vaginal needles and the tape pulled through and cut into place. A Claiborne Billings was used to ensure that the there was no tension placed on the tape. Once I was satisfied that the sling was in the correct place and at no tension, the anterior repair was done with 2 mattress stitches of 0 Vicryl. Hemostasis was achieved with gel foam and several stitches of 2-0 Vicryl to secure a pumper. The vaginal mucosa was trimmed and closed with a running locked stitch of 2-0 Vicryl.  Attention was then turned to the perineum which had some AWL just under entrance. The perineum was grasped with allises and the midline was grasped all the way to the top of the rectocele with allises.  Marcaine was injected submucosally and then incised in the midline vertically.  The mucosa was then retracted off the rectovaginal fascia with sharp and blunt dissection.  The rectum remained intact throughout the procedure and was checked with a gloved finger several times.  Two mattress stitches were then used to close the rectovaginal fascia over the defect.  The vaginal mucosa was then trimmed and then closed with a running lock stitch of 2-0 vicryl.    The cuff was then closed with interrupted figure-of-eight stitches of 0 Vicryl. One additional look in the bladder with cysto was done to ensure that the extra stitches for hemostasis under the cystocele did not go through or inhibit ureters.  A vaginal pack was placed inside the vagina with Estrace cream and the patient tolerated the procedure was returned to recovery in stable condition.   Abegail Kloeppel A

## 2015-12-21 ENCOUNTER — Encounter (HOSPITAL_COMMUNITY)
Admission: RE | Disposition: A | Discharge status: Home / Self Care | Payer: Self-pay | Source: Ambulatory Visit | Attending: Obstetrics and Gynecology

## 2015-12-21 ENCOUNTER — Ambulatory Visit (HOSPITAL_COMMUNITY): Payer: Managed Care, Other (non HMO)

## 2015-12-21 ENCOUNTER — Ambulatory Visit (HOSPITAL_COMMUNITY): Payer: Managed Care, Other (non HMO) | Admitting: Anesthesiology

## 2015-12-21 DIAGNOSIS — N814 Uterovaginal prolapse, unspecified: Secondary | ICD-10-CM | POA: Diagnosis not present

## 2015-12-21 HISTORY — PX: UNILATERAL SALPINGECTOMY: SHX6160

## 2015-12-21 HISTORY — PX: LAPAROSCOPY: SHX197

## 2015-12-21 LAB — CBC
HCT: 22.2 % — ABNORMAL LOW (ref 36.0–46.0)
HEMATOCRIT: 23.4 % — AB (ref 36.0–46.0)
HEMATOCRIT: 21.4 % — AB (ref 36.0–46.0)
Hemoglobin: 7.4 g/dL — ABNORMAL LOW (ref 12.0–15.0)
Hemoglobin: 7.9 g/dL — ABNORMAL LOW (ref 12.0–15.0)
Hemoglobin: 7.3 g/dL — ABNORMAL LOW (ref 12.0–15.0)
MCH: 31 pg (ref 26.0–34.0)
MCH: 31.1 pg (ref 26.0–34.0)
MCH: 31.6 pg (ref 26.0–34.0)
MCHC: 33.3 g/dL (ref 30.0–36.0)
MCHC: 33.8 g/dL (ref 30.0–36.0)
MCHC: 34.1 g/dL (ref 30.0–36.0)
MCV: 92.9 fL (ref 78.0–100.0)
MCV: 92.1 fL (ref 78.0–100.0)
MCV: 92.6 fL (ref 78.0–100.0)
PLATELETS: 192 10*3/uL (ref 150–400)
Platelets: 202 10*3/uL (ref 150–400)
Platelets: 178 10*3/uL (ref 150–400)
RBC: 2.39 MIL/uL — AB (ref 3.87–5.11)
RBC: 2.54 MIL/uL — ABNORMAL LOW (ref 3.87–5.11)
RBC: 2.31 MIL/uL — AB (ref 3.87–5.11)
RDW: 14.2 % (ref 11.5–15.5)
RDW: 14.1 % (ref 11.5–15.5)
RDW: 14.2 % (ref 11.5–15.5)
WBC: 8.7 10*3/uL (ref 4.0–10.5)
WBC: 10.7 10*3/uL — ABNORMAL HIGH (ref 4.0–10.5)
WBC: 6.1 10*3/uL (ref 4.0–10.5)

## 2015-12-21 LAB — DIC (DISSEMINATED INTRAVASCULAR COAGULATION)PANEL
D-Dimer, Quant: 0.89 ug/mL-FEU — ABNORMAL HIGH (ref 0.00–0.50)
Fibrinogen: 299 mg/dL (ref 210–475)
INR: 0.96
Prothrombin Time: 12.8 seconds (ref 11.4–15.2)
aPTT: 22 seconds — ABNORMAL LOW (ref 24–36)

## 2015-12-21 LAB — DIC (DISSEMINATED INTRAVASCULAR COAGULATION) PANEL
PLATELETS: 214 10*3/uL (ref 150–400)
SMEAR REVIEW: NONE SEEN

## 2015-12-21 LAB — PREPARE RBC (CROSSMATCH)

## 2015-12-21 SURGERY — LAPAROSCOPY OPERATIVE
Anesthesia: General | Site: Abdomen | Laterality: Right

## 2015-12-21 MED ORDER — OXYCODONE-ACETAMINOPHEN 5-325 MG PO TABS: 1.0000 | ORAL_TABLET | ORAL | 0 refills | Status: DC | PRN

## 2015-12-21 MED ORDER — SUGAMMADEX SODIUM 200 MG/2ML IV SOLN
INTRAVENOUS | Status: AC
  Filled 2015-12-21: qty 2

## 2015-12-21 MED ORDER — CIPROFLOXACIN IN D5W 400 MG/200ML IV SOLN
400.0000 mg | Freq: Once | INTRAVENOUS | Status: AC
  Administered 2015-12-21: 400 mg via INTRAVENOUS
  Filled 2015-12-21: qty 200

## 2015-12-21 MED ORDER — DOCUSATE SODIUM 100 MG PO CAPS: 100.0000 mg | ORAL_CAPSULE | Freq: Three times a day (TID) | ORAL | 4 refills | Status: DC

## 2015-12-21 MED ORDER — ONDANSETRON HCL 4 MG PO TABS: 4.0000 mg | ORAL_TABLET | Freq: Four times a day (QID) | ORAL | Status: DC | PRN

## 2015-12-21 MED ORDER — LACTATED RINGERS IV SOLN
INTRAVENOUS | Status: DC | PRN
  Administered 2015-12-21: 13:00:00 via INTRAVENOUS

## 2015-12-21 MED ORDER — MENTHOL 3 MG MT LOZG
1.0000 | LOZENGE | OROMUCOSAL | Status: DC | PRN
  Administered 2015-12-21: 3 mg via ORAL
  Filled 2015-12-21: qty 9

## 2015-12-21 MED ORDER — SODIUM CHLORIDE 0.9 % IV SOLN
INTRAVENOUS | Status: DC | PRN
Start: 2015-12-21 — End: 2015-12-21
  Administered 2015-12-21: 13:00:00 via INTRAVENOUS

## 2015-12-21 MED ORDER — LIDOCAINE HCL (CARDIAC) 20 MG/ML IV SOLN
INTRAVENOUS | Status: AC
Start: 2015-12-21 — End: 2015-12-21
  Filled 2015-12-21: qty 5

## 2015-12-21 MED ORDER — ONDANSETRON HCL 4 MG/2ML IJ SOLN
INTRAMUSCULAR | Status: AC
  Filled 2015-12-21: qty 2

## 2015-12-21 MED ORDER — OXYCODONE-ACETAMINOPHEN 5-325 MG PO TABS
1.0000 | ORAL_TABLET | ORAL | Status: DC | PRN
  Administered 2015-12-21 – 2015-12-22 (×4): 2 via ORAL
  Filled 2015-12-21 (×4): qty 2

## 2015-12-21 MED ORDER — IOPAMIDOL (ISOVUE-300) INJECTION 61%
100.0000 mL | Freq: Once | INTRAVENOUS | Status: AC | PRN
  Administered 2015-12-21: 100 mL via INTRAVENOUS

## 2015-12-21 MED ORDER — DOCUSATE SODIUM 100 MG PO CAPS
100.0000 mg | ORAL_CAPSULE | Freq: Three times a day (TID) | ORAL | Status: DC
  Filled 2015-12-21: qty 1

## 2015-12-21 MED ORDER — FENTANYL CITRATE (PF) 100 MCG/2ML IJ SOLN
INTRAMUSCULAR | Status: DC | PRN
  Administered 2015-12-21 (×2): 100 ug via INTRAVENOUS
  Administered 2015-12-21: 50 ug via INTRAVENOUS

## 2015-12-21 MED ORDER — DEXAMETHASONE SODIUM PHOSPHATE 10 MG/ML IJ SOLN
INTRAMUSCULAR | Status: DC | PRN
  Administered 2015-12-21: 4 mg via INTRAVENOUS

## 2015-12-21 MED ORDER — HYDROMORPHONE HCL 1 MG/ML IJ SOLN
0.2500 mg | INTRAMUSCULAR | Status: DC | PRN
  Administered 2015-12-21 (×2): 0.5 mg via INTRAVENOUS

## 2015-12-21 MED ORDER — SODIUM CHLORIDE 0.9 % IV SOLN: Freq: Once | INTRAVENOUS | Status: DC

## 2015-12-21 MED ORDER — LIDOCAINE HCL (CARDIAC) 20 MG/ML IV SOLN
INTRAVENOUS | Status: DC | PRN
  Administered 2015-12-21: 50 mg via INTRAVENOUS

## 2015-12-21 MED ORDER — ROCURONIUM BROMIDE 100 MG/10ML IV SOLN
INTRAVENOUS | Status: DC | PRN
  Administered 2015-12-21: 10 mg via INTRAVENOUS
  Administered 2015-12-21: 20 mg via INTRAVENOUS

## 2015-12-21 MED ORDER — FENTANYL CITRATE (PF) 250 MCG/5ML IJ SOLN
INTRAMUSCULAR | Status: AC
  Filled 2015-12-21: qty 5

## 2015-12-21 MED ORDER — NITROFURANTOIN MONOHYD MACRO 100 MG PO CAPS: 100.0000 mg | ORAL_CAPSULE | Freq: Two times a day (BID) | ORAL | 3 refills | Status: DC

## 2015-12-21 MED ORDER — SUGAMMADEX SODIUM 200 MG/2ML IV SOLN
INTRAVENOUS | Status: DC | PRN
  Administered 2015-12-21: 200 mg via INTRAVENOUS

## 2015-12-21 MED ORDER — ONDANSETRON HCL 4 MG/2ML IJ SOLN: 4.0000 mg | Freq: Four times a day (QID) | INTRAMUSCULAR | Status: DC | PRN

## 2015-12-21 MED ORDER — PROPOFOL 10 MG/ML IV BOLUS
INTRAVENOUS | Status: AC
  Filled 2015-12-21: qty 20

## 2015-12-21 MED ORDER — ETOMIDATE 2 MG/ML IV SOLN
INTRAVENOUS | Status: DC | PRN
  Administered 2015-12-21: 14 mg via INTRAVENOUS

## 2015-12-21 MED ORDER — GLYCOPYRROLATE 0.2 MG/ML IJ SOLN
INTRAMUSCULAR | Status: AC
  Filled 2015-12-21: qty 4

## 2015-12-21 MED ORDER — ETOMIDATE 2 MG/ML IV SOLN
INTRAVENOUS | Status: AC
  Filled 2015-12-21: qty 10

## 2015-12-21 MED ORDER — LACTATED RINGERS IR SOLN
Status: DC | PRN
Start: 2015-12-21 — End: 2015-12-21
  Administered 2015-12-21: 3000 mL

## 2015-12-21 MED ORDER — DIATRIZOATE MEGLUMINE & SODIUM 66-10 % PO SOLN
30.0000 mL | Freq: Once | ORAL | Status: AC
  Administered 2015-12-21: 30 mL via ORAL
  Filled 2015-12-21: qty 30

## 2015-12-21 MED ORDER — HYDROMORPHONE HCL 1 MG/ML IJ SOLN
INTRAMUSCULAR | Status: AC
  Administered 2015-12-21: 0.5 mg via INTRAVENOUS
  Filled 2015-12-21: qty 1

## 2015-12-21 MED ORDER — DEXAMETHASONE SODIUM PHOSPHATE 10 MG/ML IJ SOLN
INTRAMUSCULAR | Status: AC
  Filled 2015-12-21: qty 1

## 2015-12-21 MED ORDER — MIDAZOLAM HCL 2 MG/2ML IJ SOLN
INTRAMUSCULAR | Status: AC
  Filled 2015-12-21: qty 2

## 2015-12-21 MED ORDER — SUCCINYLCHOLINE CHLORIDE 200 MG/10ML IV SOSY
PREFILLED_SYRINGE | INTRAVENOUS | Status: DC | PRN
  Administered 2015-12-21: 100 mg via INTRAVENOUS

## 2015-12-21 MED ORDER — IBUPROFEN 800 MG PO TABS
800.0000 mg | ORAL_TABLET | Freq: Three times a day (TID) | ORAL | Status: DC | PRN
  Administered 2015-12-21 – 2015-12-22 (×2): 800 mg via ORAL
  Filled 2015-12-21 (×2): qty 1

## 2015-12-21 MED ORDER — ONDANSETRON HCL 4 MG/2ML IJ SOLN
INTRAMUSCULAR | Status: DC | PRN
  Administered 2015-12-21: 4 mg via INTRAVENOUS

## 2015-12-21 MED ORDER — NEOSTIGMINE METHYLSULFATE 10 MG/10ML IV SOLN
INTRAVENOUS | Status: AC
  Filled 2015-12-21: qty 1

## 2015-12-21 SURGICAL SUPPLY — 31 items
BAG SPEC RTRVL LRG 6X4 10 (ENDOMECHANICALS)
CABLE HIGH FREQUENCY MONO STRZ (ELECTRODE) IMPLANT
CATH ROBINSON RED A/P 16FR (CATHETERS) ×2 IMPLANT
CLOTH BEACON ORANGE TIMEOUT ST (SAFETY) ×4 IMPLANT
DRSG COVADERM PLUS 2X2 (GAUZE/BANDAGES/DRESSINGS) ×4 IMPLANT
DRSG OPSITE POSTOP 3X4 (GAUZE/BANDAGES/DRESSINGS) ×2 IMPLANT
DURAPREP 26ML APPLICATOR (WOUND CARE) ×4 IMPLANT
GLOVE BIO SURGEON STRL SZ7 (GLOVE) ×4 IMPLANT
GLOVE BIOGEL PI IND STRL 7.0 (GLOVE) ×2 IMPLANT
GLOVE BIOGEL PI INDICATOR 7.0 (GLOVE) ×6
GOWN STRL REUS W/TWL LRG LVL3 (GOWN DISPOSABLE) ×10 IMPLANT
LIGASURE BLUNT 5MM 37CM (INSTRUMENTS) IMPLANT
LIQUID BAND (GAUZE/BANDAGES/DRESSINGS) ×4 IMPLANT
NEEDLE INSUFFLATION 120MM (ENDOMECHANICALS) ×4 IMPLANT
NS IRRIG 1000ML POUR BTL (IV SOLUTION) ×4 IMPLANT
PACK LAPAROSCOPY BASIN (CUSTOM PROCEDURE TRAY) ×4 IMPLANT
PAD TRENDELENBURG POSITION (MISCELLANEOUS) ×4 IMPLANT
POUCH SPECIMEN RETRIEVAL 10MM (ENDOMECHANICALS) IMPLANT
SEALER TISSUE G2 CVD JAW 35 (ENDOMECHANICALS) IMPLANT
SEALER TISSUE G2 CVD JAW 45CM (ENDOMECHANICALS)
SET IRRIG TUBING LAPAROSCOPIC (IRRIGATION / IRRIGATOR) ×2 IMPLANT
SLEEVE XCEL OPT CAN 5 100 (ENDOMECHANICALS) ×3 IMPLANT
SOLUTION ELECTROLUBE (MISCELLANEOUS) ×2 IMPLANT
SUT VIC AB 2-0 UR5 27 (SUTURE) ×8 IMPLANT
SUT VICRYL RAPIDE 3 0 (SUTURE) ×4 IMPLANT
SYR 5ML LL (SYRINGE) ×4 IMPLANT
TOWEL OR 17X24 6PK STRL BLUE (TOWEL DISPOSABLE) ×8 IMPLANT
TROCAR XCEL DIL TIP R 11M (ENDOMECHANICALS) ×4 IMPLANT
TROCAR XCEL NON-BLD 5MMX100MML (ENDOMECHANICALS) ×2 IMPLANT
WARMER LAPAROSCOPE (MISCELLANEOUS) ×4 IMPLANT
WATER STERILE IRR 1000ML POUR (IV SOLUTION) ×2 IMPLANT

## 2015-12-21 NOTE — Discharge Summary (Addendum)
Physician Discharge Summary  Patient ID: Leah Lin MRN: [id number] DOB/AGE: 1965/12/26 50 y.o. A Admit date: 12/20/2015 Discharge date: 12/21/2015  Admission Diagnoses:Prolapse, SUI, cystocele, rectocele  Discharge Diagnoses: same Active Problems:   Postoperative state   Discharged Condition: good  Hospital Course: Uncomplicated TVH/TVT/R salpingectomy/A&P repair/cysto  Consults: None  Significant Diagnostic Studies: none  Treatments: surgery: Uncomplicated TVH/TVT/R salpingectomy/A&P repair/cysto   Discharge Exam: Blood pressure 103/63, pulse 85, temperature 98.6 F (37 C), temperature source Oral, resp. rate 18, height 5' 3.5" (1.613 m), weight 72.1 kg (159 lb), SpO2 100 %.    Disposition: Final discharge disposition not confirmed     Medication List    TAKE these medications   levothyroxine 125 MCG tablet Commonly known as:  SYNTHROID, LEVOTHROID Take 1 tablet (125 mcg total) by mouth daily before breakfast.   nitrofurantoin (macrocrystal-monohydrate) 100 MG capsule Commonly known as:  MACROBID Take 1 capsule (100 mg total) by mouth 2 (two) times daily.   oxyCODONE-acetaminophen 5-325 MG tablet Commonly known as:  PERCOCET/ROXICET Take 1-2 tablets by mouth every 4 (four) hours as needed for severe pain (moderate to severe pain (when tolerating fluids)).   VITAMIN D3 SUPER STRENGTH 2000 units Caps Generic drug:  Cholecalciferol Take 1 capsule by mouth daily.     Colace 100 mg po TID for 2 weeks.  Follow-up Information    Kyron Schlitt A, MD Follow up in 2 day(s).   Specialty:  Obstetrics and Gynecology Why:  return to office on friday for voiding trial at 8:30 am Contact information: [place] [address] [phone]           Signed: Jonna Dittrich A 12/21/2015, 7:11 AM

## 2015-12-21 NOTE — Brief Op Note (Signed)
12/20/2015 - 12/21/2015  1:53 PM  PATIENT:  Leah Lin  50 y.o. female  PRE-OPERATIVE DIAGNOSIS:  Active Post-Op Bleeding  POST-OPERATIVE DIAGNOSIS:  Active Post-Op Bleeding  PROCEDURE:  Operative laparoscopy with removal of portion of R tube.  Hemostasis with Arista.  SURGEON:  Surgeon(s) and Role:    * Bobbye Charleston, MD - Primary    * Olga Millers, MD - Assisting  ANESTHESIA:   general  EBL:  Total I/O In: -  Out: 650 [Urine:600; Blood:50]  BLOOD ADMINISTERED:none   SPECIMEN:  Source of Specimen:  R tube  DISPOSITION OF SPECIMEN:  PATHOLOGY  COUNTS:  YES  TOURNIQUET:  * No tourniquets in log *  DICTATION: .Note written in EPIC  PLAN OF CARE: Admit to inpatient   PATIENT DISPOSITION:  PACU - hemodynamically stable.   Delay start of Pharmacological VTE agent (>24hrs) due to surgical blood loss or risk of bleeding: not applicable  Reason for Operation:  This am the patient has some pain that was unusual in that she could not sit straight up.  Her color was good and she was eating but her abdomen was slightly distended and not tympanic.  Her pulse when I listened to her heart was about 115.  An H/H was 7.4/22 down from 13.4 preop and a CT was ordered.  CT revealed large amount of blood in abdomen with one area in the right side of the pelvis with active bleeding.  Pt was consented for blood and laparoscopy, possible laparotomy to stop bleeding.  The pt accepted and was hemodynamically stable.  Complications: none  Meds: Cipro and Flagyl incase of ex lap  Findings: normal R ovary, bleeding R tube and intact vaginal cuff.  About 1000cc of clotted blood in abdomen.  Dense, thick adhesions of omentum to anterior abdominal wall, presumably from prior surgery.   Technique:  After adequate anesthesia was achieved, the pt was prepped and draped in usual sterile fashion.  A catheter was already in place and a sponge stick was placed in vagina.  A 1 cm incision was  made above the umbilicus and the veress needle inserted without aspiration of bowel contents or blood.  The abdomen was insufflated and the 10 mm trocar introduced without complication.  The camera was introduced and two 5 mm ports placed bilaterally above and medial to the iliac crests.     Old clot could be seen and irrigation was performed to get most of it out of the pelvis.  The vaginal incision line was hemostatic as well as the hysterectomy pedicles.  Blood could be seen slowly running from the R tube, a portion of which was still in placed despite removal yesterday.  No stitch was seen on the tube or mesosalpinx and presumably it had slipped off.  The remaining tube was removed with the gyrus instrument and cautery of the mesosalpinx was achieved.  There were very thick adhesions of the omentum to the anterior abdominal wall, some of which were taken down to see the pelvis but most left in place.  The omentum and bowel that were stuck prevented easy access to the left ovary and tube- no bleeding was coming from this area apparently.  The gas was turned down several times and the cuff, pedicles and R ovary observed to be hemostatic. There were some abraided areas around the bladder and Arista was placed over this area and the cuff to ensure hemostasis.  The instruments were then removed from the abdomen  and the abdomen desufflated.  The trocars were removed and the 14mm fascial incision closed with 2-0 vicryl with a figure of eight.  The skin incisions were closed with 4-0 vicryl R and dermabond.  The catheter was retained and the sponge stick removed from the vagina.  The patient tolerated the procedure well with stable vitals and labs and no blood was given intraop.

## 2015-12-21 NOTE — Anesthesia Postprocedure Evaluation (Signed)
Anesthesia Post Note  Patient: Leah Lin  Procedure(s) Performed: Procedure(s) (LRB): LAPAROSCOPY OPERATIVE, HEMASTASIS WITH ARISTA (N/A) UNILATERAL SALPINGECTOMY (Right)  Patient location during evaluation: [facility] Anesthesia Type: General Level of consciousness: awake, awake and alert and oriented Pain management: pain level controlled Vital Signs Assessment: post-procedure vital signs reviewed and stable Respiratory status: spontaneous breathing, nonlabored ventilation and respiratory function stable Cardiovascular status: stable Postop Assessment: no signs of nausea or vomiting and adequate PO intake Anesthetic complications: no     Last Vitals:  Vitals:   12/21/15 1625 12/21/15 1732  BP: 115/69 109/72  Pulse: 100 96  Resp: 18 18  Temp: 36.7 C 37.1 C    Last Pain:  Vitals:   12/21/15 1836  TempSrc:   PainSc: 0-No pain   Pain Goal: Patients Stated Pain Goal: 3 (12/21/15 1050)               Karron Goens

## 2015-12-21 NOTE — Anesthesia Preprocedure Evaluation (Signed)
Anesthesia Evaluation  Patient identified by MRN, date of birth, ID band Patient awake    Reviewed: Allergy & Precautions, NPO status , Patient's Chart, lab work & pertinent test results  History of Anesthesia Complications Negative for: history of anesthetic complications  Airway Mallampati: II  TM Distance: >3 FB Neck ROM: Full    Dental no notable dental hx. (+) Dental Advisory Given   Pulmonary Current Smoker,    Pulmonary exam normal breath sounds clear to auscultation       Cardiovascular negative cardio ROS Normal cardiovascular exam Rhythm:Regular Rate:Normal     Neuro/Psych negative neurological ROS  negative psych ROS   GI/Hepatic negative GI ROS, Neg liver ROS,   Endo/Other  Hypothyroidism   Renal/GU negative Renal ROS  negative genitourinary   Musculoskeletal negative musculoskeletal ROS (+)   Abdominal   Peds negative pediatric ROS (+)  Hematology negative hematology ROS (+)   Anesthesia Other Findings   Reproductive/Obstetrics negative OB ROS                             Anesthesia Physical Anesthesia Plan  ASA: II and emergent  Anesthesia Plan: General   Post-op Pain Management:    Induction: Intravenous, Rapid sequence and Cricoid pressure planned  Airway Management Planned: Oral ETT  Additional Equipment:   Intra-op Plan:   Post-operative Plan: Extubation in OR  Informed Consent: I have reviewed the patients History and Physical, chart, labs and discussed the procedure including the risks, benefits and alternatives for the proposed anesthesia with the patient or authorized representative who has indicated his/her understanding and acceptance.   Dental advisory given  Plan Discussed with: CRNA  Anesthesia Plan Comments: (Emergent bleeding bring back from vaginal hysterectomy yesterday. )        Anesthesia Quick Evaluation

## 2015-12-21 NOTE — Progress Notes (Addendum)
Patient is eating, ambulating, not voiding- foley in.  Pain control is good.  BP 103/63 (BP Location: Left Arm)   Pulse 85   Temp 98.6 F (37 C) (Oral)   Resp 18   Ht 5' 3.5" (1.613 m)   Wt 72.1 kg (159 lb)   SpO2 100%   BMI 27.72 kg/m   lungs:   clear to auscultation cor:    RR- tachy for me Abdomen:  soft, appropriate tenderness, incisions intact and without erythema or exudate. ex:    no cords   Lab Results  Component Value Date   WBC 4.8 12/07/2015   HGB 13.8 12/07/2015   HCT 40.4 12/07/2015   MCV 91.0 12/07/2015   PLT 321 12/07/2015    A/P  Routine care.  Expect d/c per plan.  Pulse slighly elevated with listening and pt having some abdominal discomfort- will check CBC before D/C.  Vag pack is out.

## 2015-12-21 NOTE — Discharge Instructions (Signed)
Keep Foley in place- give patient leg bag and show how to change bags.  Instruct on care. voiding trial at office- call and come in at 830 am °

## 2015-12-21 NOTE — Op Note (Signed)
12/20/2015 - 12/21/2015  1:53 PM  PATIENT:  Leah Lin  50 y.o. female  PRE-OPERATIVE DIAGNOSIS:  Active Post-Op Bleeding  POST-OPERATIVE DIAGNOSIS:  Active Post-Op Bleeding  PROCEDURE:  Operative laparoscopy with removal of portion of R tube.  Hemostasis with Arista.  SURGEON:  Surgeon(s) and Role:    * Bobbye Charleston, MD - Primary    * Olga Millers, MD - Assisting  ANESTHESIA:   general  EBL:  Total I/O In: -  Out: 650 [Urine:600; Blood:50]  BLOOD ADMINISTERED:none   SPECIMEN:  Source of Specimen:  R tube  DISPOSITION OF SPECIMEN:  PATHOLOGY  COUNTS:  YES  TOURNIQUET:  * No tourniquets in log *  DICTATION: .Note written in EPIC  PLAN OF CARE: Admit to inpatient   PATIENT DISPOSITION:  PACU - hemodynamically stable.   Delay start of Pharmacological VTE agent (>24hrs) due to surgical blood loss or risk of bleeding: not applicable  Reason for Operation:  This am the patient has some pain that was unusual in that she could not sit straight up.  Her color was good and she was eating but her abdomen was slightly distended and not tympanic.  Her pulse when I listened to her heart was about 115.  An H/H was 7.4/22 down from 13.4 preop and a CT was ordered.  CT revealed large amount of blood in abdomen with one area in the right side of the pelvis with active bleeding.  Pt was consented for blood and laparoscopy, possible laparotomy to stop bleeding.  The pt accepted and was hemodynamically stable.  Complications: none  Meds: Cipro and Flagyl incase of ex lap  Findings: normal R ovary, bleeding R tube and intact vaginal cuff.  About 1000cc of clotted blood in abdomen.  Dense, thick adhesions of omentum to anterior abdominal wall, presumably from prior surgery.   Technique:  After adequate anesthesia was achieved, the pt was prepped and draped in usual sterile fashion.  A catheter was already in place and a sponge stick was placed in vagina.  A 1 cm incision was  made above the umbilicus and the veress needle inserted without aspiration of bowel contents or blood.  The abdomen was insufflated and the 10 mm trocar introduced without complication.  The camera was introduced and two 5 mm ports placed bilaterally above and medial to the iliac crests.     Old clot could be seen and irrigation was performed to get most of it out of the pelvis.  The vaginal incision line was hemostatic as well as the hysterectomy pedicles.  Blood could be seen slowly running from the R tube, a portion of which was still in placed despite removal yesterday.  No stitch was seen on the tube or mesosalpinx and presumably it had slipped off.  The remaining tube was removed with the gyrus instrument and cautery of the mesosalpinx was achieved.  There were very thick adhesions of the omentum to the anterior abdominal wall, some of which were taken down to see the pelvis but most left in place.  The omentum and bowel that were stuck prevented easy access to the left ovary and tube- no bleeding was coming from this area apparently.  The gas was turned down several times and the cuff, pedicles and R ovary observed to be hemostatic. There were some abraided areas around the bladder and Arista was placed over this area and the cuff to ensure hemostasis.  The instruments were then removed from the abdomen  and the abdomen desufflated.  The trocars were removed and the 92mm fascial incision closed with 2-0 vicryl with a figure of eight.  The skin incisions were closed with 4-0 vicryl R and dermabond.  The catheter was retained and the sponge stick removed from the vagina.  The patient tolerated the procedure well with stable vitals and labs and no blood was given intraop.

## 2015-12-21 NOTE — Transfer of Care (Signed)
Immediate Anesthesia Transfer of Care Note  Patient: Leah Lin  Procedure(s) Performed: Procedure(s): LAPAROSCOPY OPERATIVE, HEMASTASIS WITH ARISTA (N/A) UNILATERAL SALPINGECTOMY (Left)  Patient Location: PACU  Anesthesia Type:General  Level of Consciousness: awake  Airway & Oxygen Therapy: Patient Spontanous Breathing and Patient connected to nasal cannula oxygen  Post-op Assessment: Report given to RN and Post -op Vital signs reviewed and stable  Post vital signs: stable  Last Vitals:  Vitals:   12/21/15 0600 12/21/15 1000  BP: 103/63 (!) 99/59  Pulse: 85 96  Resp: 18 18  Temp: 37 C 37 C    Last Pain:  Vitals:   12/21/15 1050  TempSrc:   PainSc: 5       Patients Stated Pain Goal: 3 ([id number])  Complications: No apparent anesthesia complications

## 2015-12-21 NOTE — Anesthesia Procedure Notes (Signed)
Procedure Name: Intubation Date/Time: 12/21/2015 12:46 PM Performed by: Ignacia Bayley Pre-anesthesia Checklist: Patient identified, Emergency Drugs available, Suction available and Patient being monitored Patient Re-evaluated:Patient Re-evaluated prior to inductionOxygen Delivery Method: Circle system utilized Preoxygenation: Pre-oxygenation with 100% oxygen Intubation Type: IV induction, Rapid sequence and Cricoid Pressure applied Laryngoscope Size: Miller and 2 Grade View: Grade II Tube type: Oral Tube size: 7.0 mm Number of attempts: 1 Airway Equipment and Method: Stylet Placement Confirmation: ETT inserted through vocal cords under direct vision,  positive ETCO2 and breath sounds checked- equal and bilateral Secured at: 20 cm Tube secured with: Tape Dental Injury: Teeth and Oropharynx as per pre-operative assessment

## 2015-12-21 NOTE — Anesthesia Procedure Notes (Signed)
Performed by: Jonna Munro

## 2015-12-22 ENCOUNTER — Encounter (HOSPITAL_COMMUNITY): Payer: Self-pay | Admitting: Obstetrics and Gynecology

## 2015-12-22 DIAGNOSIS — N814 Uterovaginal prolapse, unspecified: Secondary | ICD-10-CM | POA: Diagnosis not present

## 2015-12-22 LAB — TYPE AND SCREEN
ABO/RH(D): B POS
ANTIBODY SCREEN: NEGATIVE
UNIT DIVISION: 0
Unit division: 0

## 2015-12-22 NOTE — Progress Notes (Signed)
Voiding trials this AM:  10:11am  Voided 100 ml    Bladder Scan 111 ml 10:53am  Voided 200 ml    Bladder Scan 72 ml  Pt states she feels no bladder pressure after voiding.    Pt requesting to go home.  Phoned Dr. Philis Pique with above information.  See order for discharge.

## 2015-12-22 NOTE — Progress Notes (Signed)
Pt discharged to home with husband.  Condition stable.  Pt ambulated to car with Astrid Divine, NT.  No equipment for home ordered at discharge.

## 2015-12-22 NOTE — Progress Notes (Signed)
Patient is eating, ambulating, not voiding yet-foley in.  Pain control is good.  Pt unable to sleep last night but pain is significantly better and she is moving around without anemia symptoms.  BP 120/67 (BP Location: Right Arm)   Pulse 93   Temp 97.5 F (36.4 C) (Tympanic)   Resp 18   Ht 5' 3.5" (1.613 m)   Wt 72.1 kg (159 lb)   LMP 11/29/2012   SpO2 93%   BMI 27.72 kg/m   lungs: clear to auscultation cor:    RRR Abdomen:  soft, appropriate tenderness, incisions intact and without erythema or exudate. ex:    no cords   Lab Results  Component Value Date   WBC 6.1 12/21/2015   HGB 7.3 (L) 12/21/2015   HCT 21.4 (L) 12/21/2015   MCV 92.6 12/21/2015   PLT 178 12/21/2015    A/P  PO 2 TVH/TVT; PO1 laparscopy for post op bleeding.  Pt doing very well and H/H was still stable 6 hours post op.  No anemia sx. Routine care.  Expect d/c per plan but will do voiding trial here before d/c.  D/w pt she still has L tube.

## 2015-12-22 NOTE — Progress Notes (Signed)
Called to room. Pt states that she vomited all the water given to her to help her void post foley. Pt states that she has been unable to sleep. Pt states that she is going to try and sleep and states that she feels that will help her with her state of concern. Note placed on door so the patient can try and get some uninterrupted rest. Toya Smothers, RN

## 2016-01-05 ENCOUNTER — Other Ambulatory Visit: Payer: Self-pay | Admitting: Obstetrics and Gynecology

## 2016-02-16 ENCOUNTER — Ambulatory Visit: Payer: Managed Care, Other (non HMO) | Admitting: Internal Medicine

## 2016-03-27 ENCOUNTER — Ambulatory Visit: Payer: Managed Care, Other (non HMO) | Admitting: Internal Medicine

## 2016-05-17 ENCOUNTER — Encounter: Payer: Self-pay | Admitting: Internal Medicine

## 2016-05-17 ENCOUNTER — Ambulatory Visit (INDEPENDENT_AMBULATORY_CARE_PROVIDER_SITE_OTHER): Payer: Managed Care, Other (non HMO) | Admitting: Internal Medicine

## 2016-05-17 VITALS — BP 132/80 | HR 96 | Ht 63.5 in | Wt 151.0 lb

## 2016-05-17 DIAGNOSIS — E040 Nontoxic diffuse goiter: Secondary | ICD-10-CM

## 2016-05-17 DIAGNOSIS — E559 Vitamin D deficiency, unspecified: Secondary | ICD-10-CM | POA: Diagnosis not present

## 2016-05-17 DIAGNOSIS — E038 Other specified hypothyroidism: Secondary | ICD-10-CM | POA: Diagnosis not present

## 2016-05-17 DIAGNOSIS — E063 Autoimmune thyroiditis: Secondary | ICD-10-CM

## 2016-05-17 LAB — T4, FREE: Free T4: 1.15 ng/dL (ref 0.60–1.60)

## 2016-05-17 LAB — VITAMIN D 25 HYDROXY (VIT D DEFICIENCY, FRACTURES): VITD: 25.64 ng/mL — ABNORMAL LOW (ref 30.00–100.00)

## 2016-05-17 LAB — TSH: TSH: 0.09 u[IU]/mL — ABNORMAL LOW (ref 0.35–4.50)

## 2016-05-17 NOTE — Progress Notes (Signed)
Patient ID: Leah Lin, female   DOB: Jul 07, 1965, 50 y.o.   MRN: [id number]   HPI  Leah Lin is a 50 y.o.-year-old female, returning for follow-up for vitamin D deficiency and Hashimoto's hypothyroidism. Last visit 9 months ago. She was previously followed by Dr. Ester Rink, endocrinologist at [facility].  She started Isogenix diet 1 mo ago - drinks the first shake 2h after LT4.  She lost 8 lbs.   Vitamin D deficiency  - dx in ~2010.  Reviewed levels: Lab Results  Component Value Date   VD25OH 26.80 (L) 08/16/2015  08/17/2013: Vitamin D 20.1 10/20/2012: Vitamin D 21.6 09/08/2012: Vitamin D 16.0 09/11/2011: Vitamin D 13.7  The records from Dr. Kalman Shan, patient had negative blood celiac workup in 08/2012.  She was intermittently compliant with her Vit D supplements in the past. She is still missing the supplement (2000 units daily)  frequently.   Pt. has been dx with hypothyroidism in 2010; 2/2 Hashimoto's thyroiditis  She is on Levothyroxine 125 mcg, taken: - fasting - with water - no b'fast  - + coffee + creamer now moved more than 30 min after the LT4 - no calcium, iron, PPIs, multivitamins   I reviewed pt's thyroid tests: Lab Results  Component Value Date   TSH 1.07 08/16/2015   FREET4 1.22 08/16/2015  - per records from Dr Kalman Shan: 08/17/2013: TSH 1.300 10/20/2012: TSH 0.637 09/08/2012: TSH 0.210 09/11/2011: TSH 0.945 03/05/2011: TSH 1.170 06/16/2010: TSH 1.640  Pt describes: - + weight loss - intentional - no fatigue - No cold intolerance, + hot flushes - no depression, anxiety - no constipation - no dry skin - no hair loss  Pt denies feeling nodules in neck, hoarseness, dysphagia/odynophagia, SOB with lying down.  She has a h/o non-nodular goiter. - thyroid ultrasound 10/13/2011: Right lobe: 5.6 x 2.0 x 1.9 cm  Left lobe: 5.0 x 1.7 x 1.6 cm.  Both lobes are relatively hypervascular and heterogeneous in appearance, similar to previous exam  (2011). No new discrete nodule suggested.   She has + FH of thyroid disorders in: father and sister (hypothroidism). No FH of thyroid cancer.  No h/o radiation tx to head or neck. No recent use of iodine supplements. No Biotin.  + skin psoriasis.   ROS: Constitutional: see HPI Eyes: no blurry vision, no xerophthalmia ENT: no sore throat, no nodules palpated in throat, no dysphagia/odynophagia, no hoarseness Cardiovascular: no CP/SOB/palpitations/leg swelling Respiratory: no cough/SOB Gastrointestinal: no N/V/D/C Musculoskeletal: no muscle/joint aches Skin: no rashes Neurological: no tremors/numbness/tingling/dizziness  I reviewed pt's medications, allergies, PMH, social hx, family hx, and changes were documented in the history of present illness. Otherwise, unchanged from my initial visit note.  Past Medical History:  Diagnosis Date  . Complication of anesthesia    hard time waking up after anesthesia  . Goiter   . Hypothyroidism   . Psoriasis   . Thyroid disease    Hashimoto's thyroiditis  . Vitamin D deficiency    Past Surgical History:  Procedure Laterality Date  . ANTERIOR AND POSTERIOR REPAIR N/A 12/20/2015   Procedure: ANTERIOR (CYSTOCELE) AND POSTERIOR REPAIR (RECTOCELE),PERITONEAL BIOPSY;  Surgeon: Bobbye Charleston, MD;  Location: [facility];  Service: Gynecology;  Laterality: N/A;  . BLADDER SUSPENSION  12/20/2015   Procedure: TRANSVAGINAL TAPE (TVT) PROCEDURE;  Surgeon: Bobbye Charleston, MD;  Location: [facility];  Service: Gynecology;;  . BREAST ENHANCEMENT SURGERY    . CYSTOSCOPY N/A 12/20/2015   Procedure: CYSTOSCOPY;  Surgeon: Bobbye Charleston, MD;  Location: [facility];  Service: Gynecology;  Laterality: N/A;  . LAPAROSCOPY N/A 12/21/2015   Procedure: LAPAROSCOPY OPERATIVE, HEMASTASIS WITH ARISTA;  Surgeon: Bobbye Charleston, MD;  Location: [facility];  Service: Gynecology;  Laterality: N/A;  . UNILATERAL SALPINGECTOMY Right 12/21/2015   Procedure: UNILATERAL SALPINGECTOMY;  Surgeon:  Bobbye Charleston, MD;  Location: [facility];  Service: Gynecology;  Laterality: Right;  Marland Kitchen VAGINAL HYSTERECTOMY N/A 12/20/2015   Procedure: HYSTERECTOMY VAGINAL WITH RIGHT SALPINGECTOMY;  Surgeon: Bobbye Charleston, MD;  Location: [facility];  Service: Gynecology;  Laterality: N/A;   Social History   Social History  . Marital Status: Married    Spouse Name: N/A  . Number of Children: 2   Occupational History  . homemaker   Social History Main Topics  . Smoking status: Former Research scientist (life sciences), quit in 2013  . Smokeless tobacco: Not on file  . Alcohol Use: Wine, liquor, 3-4x a week  . Drug Use: No   Current Outpatient Prescriptions on File Prior to Visit  Medication Sig Dispense Refill  . Cholecalciferol (VITAMIN D3 SUPER STRENGTH) 2000 units CAPS Take 1 capsule by mouth daily.    Marland Kitchen docusate sodium (COLACE) 100 MG capsule Take 1 capsule (100 mg total) by mouth 3 (three) times daily after meals. 60 capsule 4  . levothyroxine (SYNTHROID, LEVOTHROID) 125 MCG tablet Take 1 tablet (125 mcg total) by mouth daily before breakfast. 90 tablet 3  . nitrofurantoin, macrocrystal-monohydrate, (MACROBID) 100 MG capsule Take 1 capsule (100 mg total) by mouth 2 (two) times daily. 14 capsule 3  . oxyCODONE-acetaminophen (PERCOCET/ROXICET) 5-325 MG tablet Take 1-2 tablets by mouth every 4 (four) hours as needed for severe pain (moderate to severe pain (when tolerating fluids)). 30 tablet 0   No current facility-administered medications on file prior to visit.    Allergies  Allergen Reactions  . Tetracyclines & Related Swelling  . Amoxicillin Rash    Has patient had a PCN reaction causing immediate rash, facial/tongue/throat swelling, SOB or lightheadedness with hypotension: No Has patient had a PCN reaction causing severe rash involving mucus membranes or skin necrosis: No Has patient had a PCN reaction that required hospitalization No Has patient had a PCN reaction occurring within the last 10 years: No If all of the  above answers are "NO", then may proceed with Cephalosporin use.    Family History  Problem Relation Age of Onset  . Hypertension Mother   . Cancer Mother   . Diabetes Father   . Hypertension Father   . Thyroid disease Father   . Diabetes Sister   . Thyroid disease Sister   . Cancer Sister    PE: BP 132/80   Pulse 96   Ht 5' 3.5" (1.613 m)   Wt 151 lb (68.5 kg)   LMP 11/29/2012   SpO2 98%   BMI 26.33 kg/m  Wt Readings from Last 3 Encounters:  05/17/16 151 lb (68.5 kg)  12/20/15 159 lb (72.1 kg)  08/16/15 159 lb (72.1 kg)   Constitutional: overweight, in NAD Eyes: PERRLA, EOMI, no exophthalmos ENT: moist mucous membranes, slight symmetric thyromegaly, no cervical lymphadenopathy Cardiovascular: Tachycardia, RR, No MRG Respiratory: CTA B Gastrointestinal: abdomen soft, NT, ND, BS+ Musculoskeletal: no deformities, strength intact in all 4 Skin: moist, warm, no rashes Neurological: no tremor with outstretched hands, DTR normal in all 4  ASSESSMENT: 1. Hashimoto's Hypothyroidism  2. Vit D deficiency  3. Goiter.   PLAN:  1. Patient with long-standing hypothyroidism, on levothyroxine therapy (125 g daily).  -  She appears euthyroid. No thyroid nodules, or neck compression symptoms. Her thyroid is slightly enlarged. - We discussed about correct intake of levothyroxine, fasting, with water, separated by at least 30 minutes from breakfast, and separated by more than 4 hours from calcium, iron, multivitamins, acid reflux medications (PPIs). She is taking this correctly now. At last visit, she was drinking coffee with creamer less than 30 minutes after taking levothyroxine. I advised her to move this at least 30 minutes after. She does this now. She also wait 2h before taking the Isogenix shake, which is great. - will check thyroid tests today: TSH, free T4 - If these are abnormal, she will need to return in 6 weeks for repeat labs - If these are normal, I will see her back in  one year  2.  Vit D deficiency - Patient with history of vitamin D deficiency, with no checks in the last 2 years - She is taking vitamin D3 2000 units daily now. At last visit, she was skipping many doses, so that she ended up taking it approximately 3 times a week. Her vitamin D level was low, at 26, so I advised her to try to take it every day, will but we did not change the dose. She tells me she is still taking it sporadically, but her Isogenix shakes contain many vitamins, so she may be getting enough vitamin D - will recheck today  3. Goiter - Non-compressing, Non-toxic - Non-nodular per the last ultrasound report from 2013.  - No further repeat ultrasounds needed.  Office Visit on 05/17/2016  Component Date Value Ref Range Status  . Free T4 05/17/2016 1.15  0.60 - 1.60 ng/dL Final   Comment: Specimens from patients who are undergoing biotin therapy and /or ingesting biotin supplements may contain high levels of biotin.  The higher biotin concentration in these specimens interferes with this Free T4 assay.  Specimens that contain high levels  of biotin may cause false high results for this Free T4 assay.  Please interpret results in light of the total clinical presentation of the patient.    Marland Kitchen TSH 05/17/2016 0.09* 0.35 - 4.50 uIU/mL Final  . VITD 05/17/2016 25.64* 30.00 - 100.00 ng/mL Final   TSH suppressed. Will decrease the levothyroxine to 112 g and have her back for labs in 8 weeks. Vitamin D is lower than before. Per her preference, will start ergocalciferol 50,000 units weekly. I will add a vitamin D with the next lab draw.  CC: [place]  [place], Delsa Grana., MD  Doyle Askew., PA-C

## 2016-05-17 NOTE — Patient Instructions (Signed)
Please stop at the lab.  Please continue the vitamin D 2000 units daily >> try to take it every day.  Continue Levothyroxine 125 mcg daily.  Take the thyroid hormone every day, with water, at least 30 minutes before breakfast, separated by at least 4 hours from: - acid reflux medications - calcium - iron - multivitamins  Please come back for a follow-up appointment in 1 year.

## 2016-05-18 MED ORDER — LEVOTHYROXINE SODIUM 112 MCG PO TABS: 112.0000 ug | ORAL_TABLET | Freq: Every day | ORAL | 1 refills | Status: DC

## 2016-05-18 MED ORDER — VITAMIN D (ERGOCALCIFEROL) 1.25 MG (50000 UNIT) PO CAPS: 50000.0000 [IU] | ORAL_CAPSULE | ORAL | 5 refills | Status: DC

## 2016-07-10 ENCOUNTER — Other Ambulatory Visit: Payer: Managed Care, Other (non HMO)

## 2016-07-16 ENCOUNTER — Other Ambulatory Visit (INDEPENDENT_AMBULATORY_CARE_PROVIDER_SITE_OTHER): Payer: Managed Care, Other (non HMO)

## 2016-07-16 DIAGNOSIS — E038 Other specified hypothyroidism: Secondary | ICD-10-CM | POA: Diagnosis not present

## 2016-07-16 DIAGNOSIS — E559 Vitamin D deficiency, unspecified: Secondary | ICD-10-CM

## 2016-07-16 DIAGNOSIS — E063 Autoimmune thyroiditis: Secondary | ICD-10-CM | POA: Diagnosis not present

## 2016-07-16 LAB — T4, FREE: FREE T4: 1.33 ng/dL (ref 0.60–1.60)

## 2016-07-16 LAB — TSH: TSH: 1.55 u[IU]/mL (ref 0.35–4.50)

## 2016-07-16 LAB — VITAMIN D 25 HYDROXY (VIT D DEFICIENCY, FRACTURES): VITD: 33.02 ng/mL (ref 30.00–100.00)

## 2016-08-14 ENCOUNTER — Other Ambulatory Visit: Payer: Self-pay | Admitting: Internal Medicine

## 2016-08-14 MED ORDER — LEVOTHYROXINE SODIUM 112 MCG PO TABS: 112.0000 ug | ORAL_TABLET | Freq: Every day | ORAL | 1 refills | Status: DC

## 2016-08-14 NOTE — Telephone Encounter (Signed)
Please advise, last note says decrease to 196mcg, but the plan states to continueu 132mcg, Which dose do I need to send in? Thank you!

## 2016-08-14 NOTE — Telephone Encounter (Signed)
Submitted 112 mcg dose.

## 2016-08-14 NOTE — Telephone Encounter (Signed)
112 mcg

## 2017-01-17 ENCOUNTER — Other Ambulatory Visit: Payer: Self-pay | Admitting: Internal Medicine

## 2017-04-27 ENCOUNTER — Other Ambulatory Visit: Payer: Self-pay | Admitting: Internal Medicine

## 2017-05-03 ENCOUNTER — Ambulatory Visit: Payer: Managed Care, Other (non HMO) | Admitting: Internal Medicine

## 2017-06-11 ENCOUNTER — Ambulatory Visit (INDEPENDENT_AMBULATORY_CARE_PROVIDER_SITE_OTHER): Payer: Managed Care, Other (non HMO) | Admitting: Internal Medicine

## 2017-06-11 ENCOUNTER — Encounter: Payer: Self-pay | Admitting: Internal Medicine

## 2017-06-11 VITALS — BP 140/88 | HR 78 | Temp 97.9°F | Ht 64.0 in | Wt 155.0 lb

## 2017-06-11 DIAGNOSIS — E063 Autoimmune thyroiditis: Secondary | ICD-10-CM

## 2017-06-11 DIAGNOSIS — E038 Other specified hypothyroidism: Secondary | ICD-10-CM

## 2017-06-11 DIAGNOSIS — E559 Vitamin D deficiency, unspecified: Secondary | ICD-10-CM

## 2017-06-11 DIAGNOSIS — E040 Nontoxic diffuse goiter: Secondary | ICD-10-CM | POA: Diagnosis not present

## 2017-06-11 LAB — VITAMIN D 25 HYDROXY (VIT D DEFICIENCY, FRACTURES): VITD: 37.98 ng/mL (ref 30.00–100.00)

## 2017-06-11 LAB — T4, FREE: Free T4: 1.37 ng/dL (ref 0.60–1.60)

## 2017-06-11 LAB — TSH: TSH: 2.17 u[IU]/mL (ref 0.35–4.50)

## 2017-06-11 MED ORDER — VITAMIN D (ERGOCALCIFEROL) 1.25 MG (50000 UNIT) PO CAPS: 50000.0000 [IU] | ORAL_CAPSULE | ORAL | 5 refills | Status: DC

## 2017-06-11 MED ORDER — LEVOTHYROXINE SODIUM 112 MCG PO TABS: ORAL_TABLET | ORAL | 0 refills | Status: DC

## 2017-06-11 MED ORDER — LEVOTHYROXINE SODIUM 112 MCG PO TABS: ORAL_TABLET | ORAL | 3 refills | Status: DC

## 2017-06-11 NOTE — Patient Instructions (Addendum)
Please stop at the lab.  Please continue Levothyroxine 112 mcg daily.  Take the thyroid hormone every day, with water, at least 30 minutes before breakfast, separated by at least 4 hours from: - acid reflux medications - calcium - iron - multivitamins   Continue Ergocalciferol 50,000 units weekly.  Please come back for a follow-up appointment in 1 year.

## 2017-06-11 NOTE — Progress Notes (Signed)
Patient ID: Leah Lin, female   DOB: 1966-04-17, 52 y.o.   MRN: [id number]   HPI  Leah Lin is a 52 y.o.-year-old female, returning for follow-up for vitamin D deficiency and Hashimoto's hypothyroidism. Last visit 1 year ago. She was previously followed by Dr. Ester Rink, endocrinologist at [facility].  Vitamin D deficiency  - dx in ~2010  She was intermittently compliant with her daily vit D in the past >> at last visit, we changed to high dose weekly vitamin D (50,000 IU). Vit D normalized on this dose. She misses occasional doses.  Reviewed levels: Lab Results  Component Value Date   VD25OH 33.02 07/16/2016   VD25OH 25.64 (L) 05/17/2016   VD25OH 26.80 (L) 08/16/2015  08/17/2013: Vitamin D 20.1 10/20/2012: Vitamin D 21.6 09/08/2012: Vitamin D 16.0 09/11/2011: Vitamin D 13.7  The records from Dr. Kalman Shan, patient had negative blood celiac workup in 08/2012.  Hypothyroidism - dx 2010 - 2/2 Hashimoto's thyroiditis  Pt is on Synthroid DAW 112 mcg daily, taken: - in am - fasting - at least 30 min from b'fast - no Ca, Fe, MVI, PPIs - not on Biotin  I reviewed pt's thyroid tests - normal at last check, after we decreased her Synthroid dose from 125 to 112 mcg daily: Lab Results  Component Value Date   TSH 1.55 07/16/2016   TSH 0.09 (L) 05/17/2016   TSH 1.07 08/16/2015   FREET4 1.33 07/16/2016   FREET4 1.15 05/17/2016   FREET4 1.22 08/16/2015  - per records from Dr Kalman Shan: 08/17/2013: TSH 1.300 10/20/2012: TSH 0.637 09/08/2012: TSH 0.210 09/11/2011: TSH 0.945 03/05/2011: TSH 1.170 06/16/2010: TSH 1.640  She has + FH of thyroid disorders in: father and sister (hypothroidism). No FH of thyroid cancer. No h/o radiation tx to head or neck.  No seaweed or kelp. No recent contrast studies. No herbal supplements. No Biotin use. No recent steroids use.   Non-nodular goiter. - Reviewed the report of the thyroid ultrasound 10/13/2011: Right lobe: 5.6 x 2.0 x 1.9  cm  Left lobe: 5.0 x 1.7 x 1.6 cm.  Both lobes are relatively hypervascular and heterogeneous in appearance, similar to previous exam (2011). No new discrete nodule suggested.   Pt denies: - feeling nodules in neck - hoarseness - dysphagia - choking - SOB with lying down  + skin psoriasis.   ROS: Constitutional: no weight gain/no weight loss, no fatigue, no subjective hyperthermia, no subjective hypothermia Eyes: no blurry vision, no xerophthalmia ENT: no sore throat, + see HPI Cardiovascular: no CP/no SOB/no palpitations/no leg swelling Respiratory: no cough/no SOB/no wheezing Gastrointestinal: no N/no V/no D/no C/no acid reflux Musculoskeletal: no muscle aches/no joint aches Skin: no rashes, no hair loss Neurological: no tremors/no numbness/no tingling/no dizziness  I reviewed pt's medications, allergies, PMH, social hx, family hx, and changes were documented in the history of present illness. Otherwise, unchanged from my initial visit note.  Past Medical History:  Diagnosis Date  . Complication of anesthesia    hard time waking up after anesthesia  . Goiter   . Hypothyroidism   . Psoriasis   . Thyroid disease    Hashimoto's thyroiditis  . Vitamin D deficiency    Past Surgical History:  Procedure Laterality Date  . ANTERIOR AND POSTERIOR REPAIR N/A 12/20/2015   Procedure: ANTERIOR (CYSTOCELE) AND POSTERIOR REPAIR (RECTOCELE),PERITONEAL BIOPSY;  Surgeon: Bobbye Charleston, MD;  Location: [facility];  Service: Gynecology;  Laterality: N/A;  . BLADDER SUSPENSION  12/20/2015   Procedure: TRANSVAGINAL  TAPE (TVT) PROCEDURE;  Surgeon: Bobbye Charleston, MD;  Location: [facility];  Service: Gynecology;;  . BREAST ENHANCEMENT SURGERY    . CYSTOSCOPY N/A 12/20/2015   Procedure: CYSTOSCOPY;  Surgeon: Bobbye Charleston, MD;  Location: [facility];  Service: Gynecology;  Laterality: N/A;  . LAPAROSCOPY N/A 12/21/2015   Procedure: LAPAROSCOPY OPERATIVE, HEMASTASIS WITH ARISTA;  Surgeon: Bobbye Charleston,  MD;  Location: [facility];  Service: Gynecology;  Laterality: N/A;  . UNILATERAL SALPINGECTOMY Right 12/21/2015   Procedure: UNILATERAL SALPINGECTOMY;  Surgeon: Bobbye Charleston, MD;  Location: [facility];  Service: Gynecology;  Laterality: Right;  Marland Kitchen VAGINAL HYSTERECTOMY N/A 12/20/2015   Procedure: HYSTERECTOMY VAGINAL WITH RIGHT SALPINGECTOMY;  Surgeon: Bobbye Charleston, MD;  Location: [facility];  Service: Gynecology;  Laterality: N/A;   Social History   Social History  . Marital Status: Married    Spouse Name: N/A  . Number of Children: 2   Occupational History  . homemaker   Social History Main Topics  . Smoking status: Former Research scientist (life sciences), quit in 2013  . Smokeless tobacco: Not on file  . Alcohol Use: Wine, liquor, 3-4x a week  . Drug Use: No   Current Outpatient Medications on File Prior to Visit  Medication Sig Dispense Refill  . cetirizine (ZYRTEC) 10 MG tablet Take 10 mg by mouth daily.    . Cholecalciferol (VITAMIN D3 SUPER STRENGTH) 2000 units CAPS Take 1 capsule by mouth daily.    . clobetasol cream (TEMOVATE) 4.27 % Apply 1 application topically 2 (two) times daily.    Marland Kitchen SYNTHROID 112 MCG tablet TAKE 1 TABLET (112 MCG TOTAL) BY MOUTH DAILY BEFORE BREAKFAST. 30 tablet 0  . Vitamin D, Ergocalciferol, (DRISDOL) 50000 units CAPS capsule Take 1 capsule (50,000 Units total) by mouth every 7 (seven) days. 10 capsule 5   No current facility-administered medications on file prior to visit.    Allergies  Allergen Reactions  . Tetracyclines & Related Swelling  . Amoxicillin Rash    Has patient had a PCN reaction causing immediate rash, facial/tongue/throat swelling, SOB or lightheadedness with hypotension: No Has patient had a PCN reaction causing severe rash involving mucus membranes or skin necrosis: No Has patient had a PCN reaction that required hospitalization No Has patient had a PCN reaction occurring within the last 10 years: No If all of the above answers are "NO", then may proceed with  Cephalosporin use.    Family History  Problem Relation Age of Onset  . Hypertension Mother   . Cancer Mother   . Diabetes Father   . Hypertension Father   . Thyroid disease Father   . Diabetes Sister   . Thyroid disease Sister   . Cancer Sister    PE: BP 140/88   Pulse 78   Temp 97.9 F (36.6 C) (Oral)   Ht 5\' 4"  (1.626 m)   Wt 155 lb (70.3 kg)   LMP 11/29/2012   SpO2 97%   BMI 26.61 kg/m  Wt Readings from Last 3 Encounters:  06/11/17 155 lb (70.3 kg)  05/17/16 151 lb (68.5 kg)  12/20/15 159 lb (72.1 kg)   Constitutional: overweight, in NAD Eyes: PERRLA, EOMI, no exophthalmos ENT: moist mucous membranes, + slight symmetric thyromegaly, no cervical lymphadenopathy Cardiovascular: RRR, No MRG Respiratory: CTA B Gastrointestinal: abdomen soft, NT, ND, BS+ Musculoskeletal: no deformities, strength intact in all 4 Skin: moist, warm, no rashes Neurological: no tremor with outstretched hands, DTR normal in all 4  ASSESSMENT: 1. Hashimoto's Hypothyroidism  2. Vit D  deficiency  3. Goiter.   PLAN:  1. Patient with long-standing hypothyroidism, on Synthroid DAW tx (dose decreased in 04/2016). Apparently the pharmacy switched her to brand name Synthroid as the generic dose was on back order >> she is interested to go back on the generic LT4 - latest thyroid labs reviewed with pt >> normal in 06/2016 - she continues on Synthroid 112 mcg daily (dose decreased from 125 mcg at last visit) - pt feels good on this dose. - we discussed about taking the thyroid hormone every day, with water, >30 minutes before breakfast, separated by >4 hours from acid reflux medications, calcium, iron, multivitamins. Pt. is taking it correctly - will check thyroid tests today: TSH and fT4 - If labs are abnormal, she will need to return for repeat TFTs in 1.5 months - OTW, RTC in 1 year  2.  Vit D deficiency - vit D was low at last visit >> we started ergocalciferol 50,000 IU weekly >> vit D  normalized afterwards - will recheck today  3. Goiter - non-toxic - no neck compression sxs - non-nodular per latest U/S in 2013 - report reviewed  - no further repeat U/S's needed >> she agrees  Needs refills (Levothyroxine generic) and Ergocalciferol.  Component     Latest Ref Rng & Units 06/11/2017  TSH     0.35 - 4.50 uIU/mL 2.17  T4,Free(Direct)     0.60 - 1.60 ng/dL 1.37  VITD     30.00 - 100.00 ng/mL 37.98    CC: [place], Delsa Grana., MD  Doyle Askew., PA-C  Philemon Kingdom, MD PhD [facility] Endocrinology

## 2017-12-20 IMAGING — CT CT ABD-PELV W/ CM
1 of 3 series · 14 of 32 positions shown, 19 images · IV contrast (OMNIPAQUE)
Comparison: None.

CLINICAL DATA: Status post vaginal hysterectomy with low
hemoglobin. Possible bleed.

EXAM:
CT ABDOMEN AND PELVIS WITH CONTRAST
TECHNIQUE: Multidetector CT imaging of the abdomen and pelvis was performed
using the standard protocol following bolus administration of
intravenous contrast.
CONTRAST:  100mL 37ODEW-RHH IOPAMIDOL (37ODEW-RHH) INJECTION 61%

[Series 2: routine abdomen/pelvis with · axial · 0.88mm/px · z∈[+526,+956]mm · 14 of 100 slices shown, 19 images]
[im 7/100  soft-tissue]
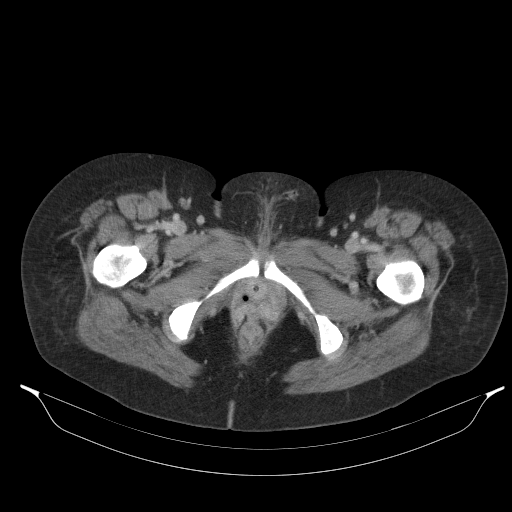
[im 7/100  bone]
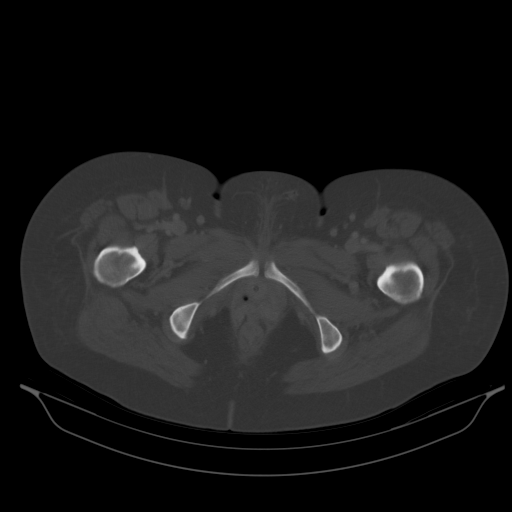
[im 13/100  soft-tissue]
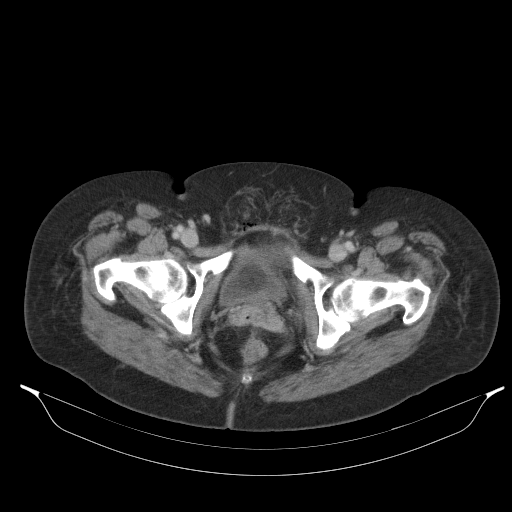
[im 19/100  soft-tissue]
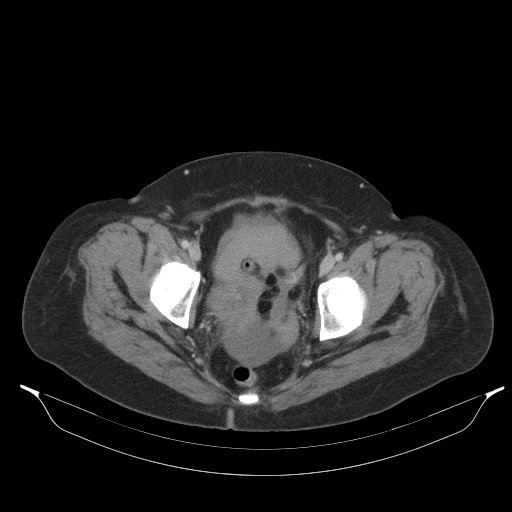
[im 31/100  soft-tissue]
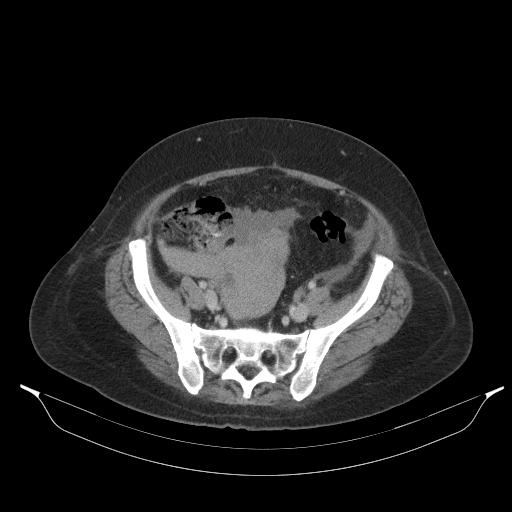
[im 38/100  soft-tissue]
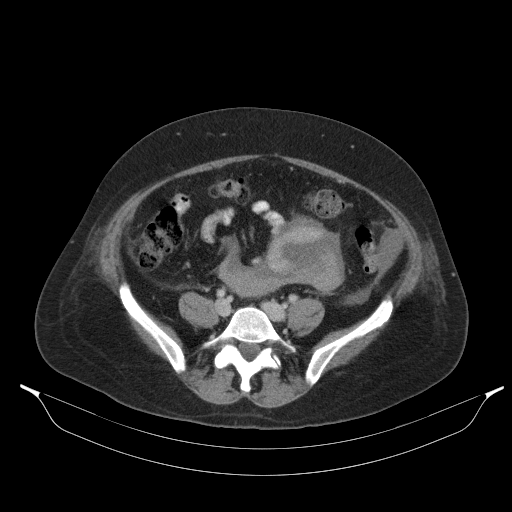
[im 44/100  soft-tissue]
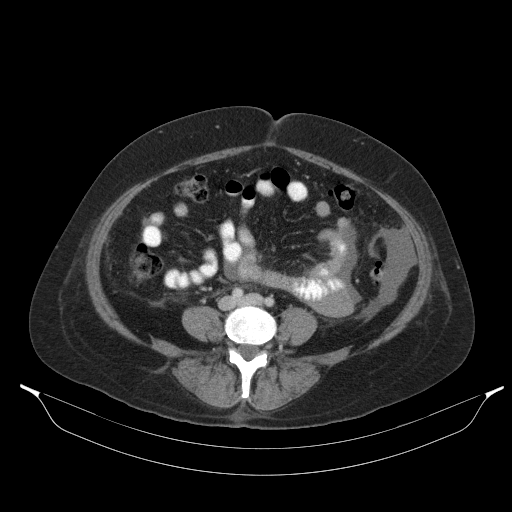
[im 50/100  soft-tissue]
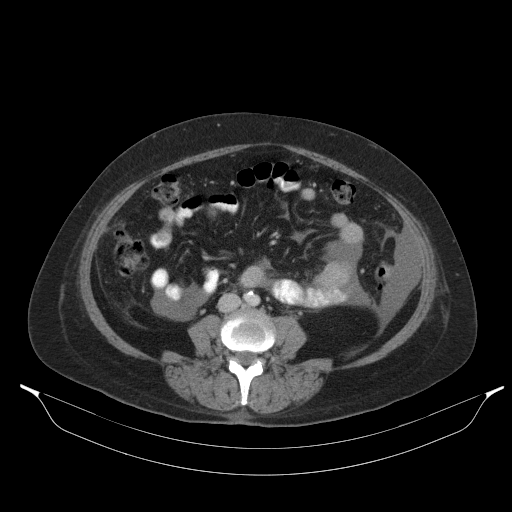
[im 56/100  soft-tissue]
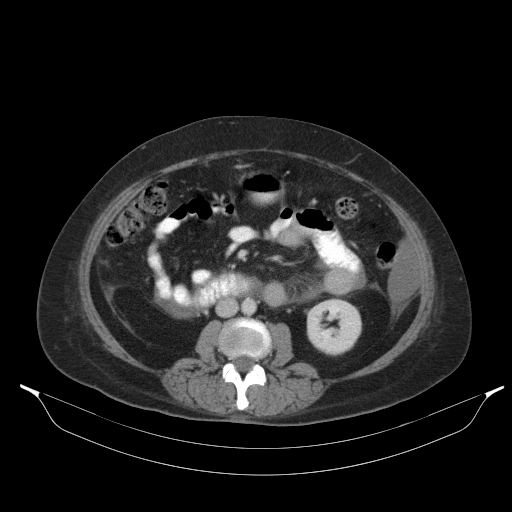
[im 62/100  soft-tissue]
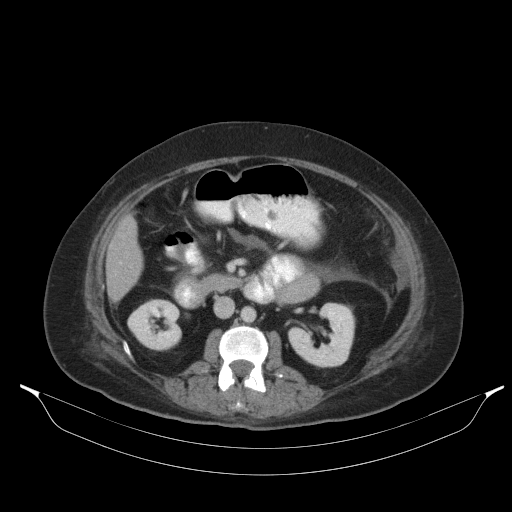
[im 62/100  bone]
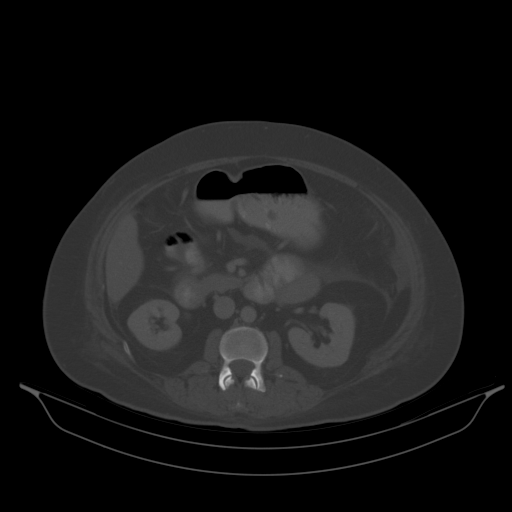
[im 69/100  soft-tissue]
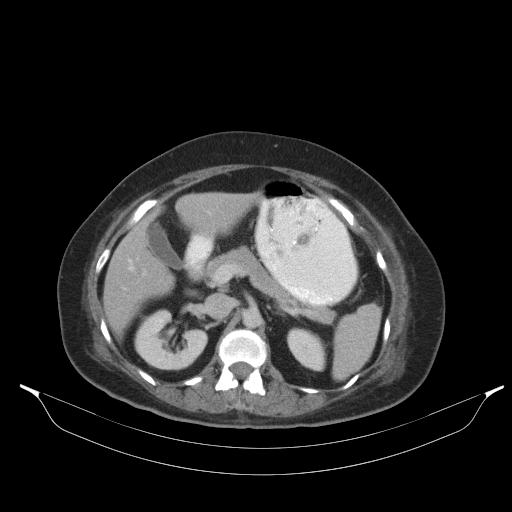
[im 75/100  lung]
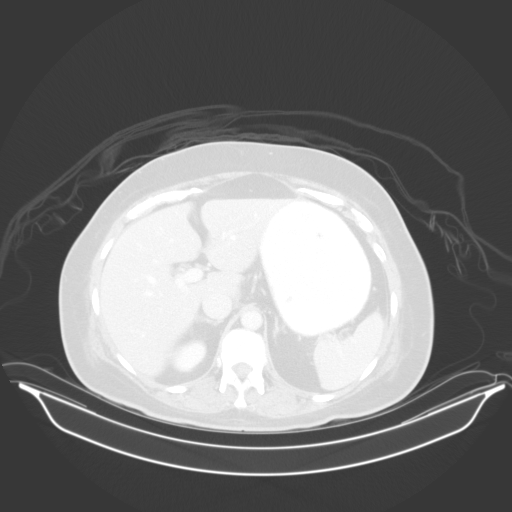
[im 81/100  soft-tissue]
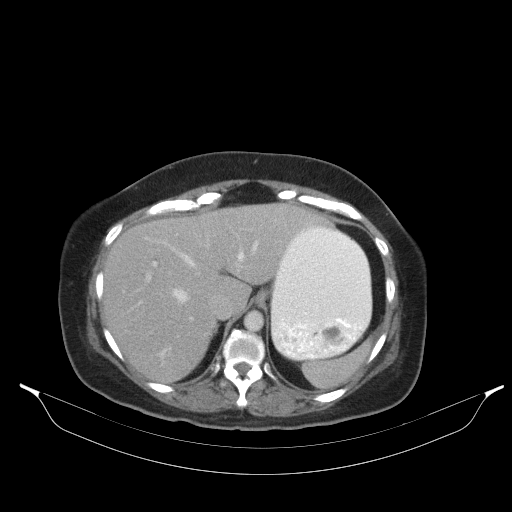
[im 81/100  lung]
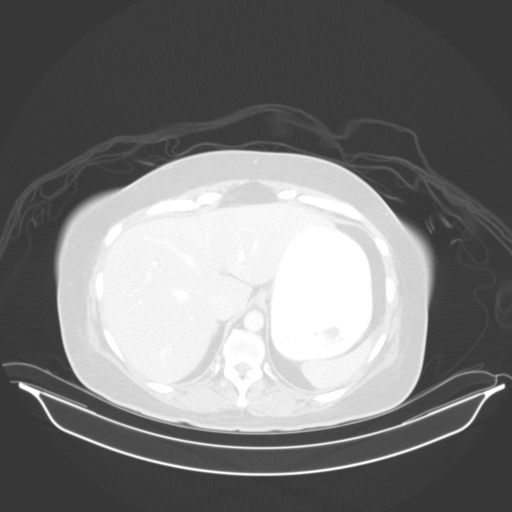
[im 87/100  soft-tissue]
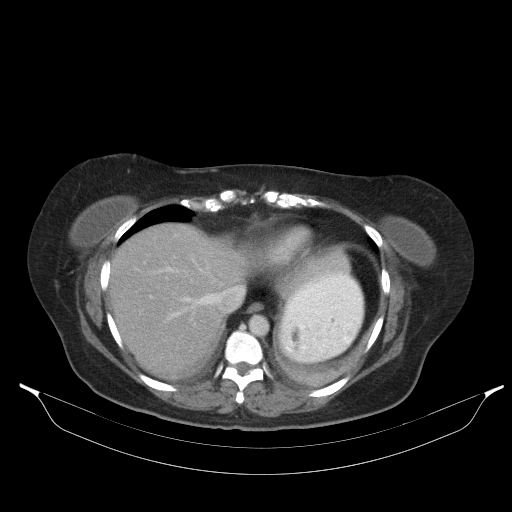
[im 87/100  lung]
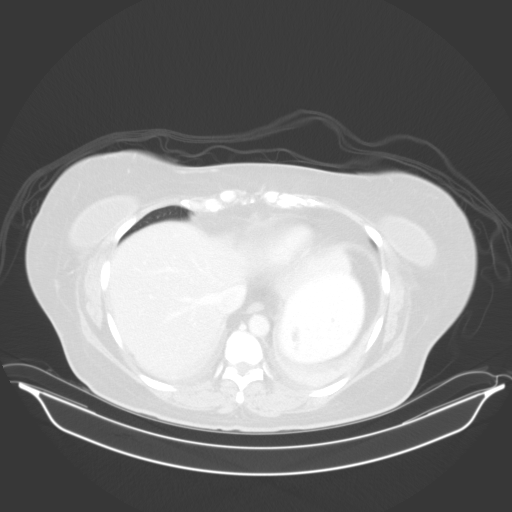
[im 93/100  soft-tissue]
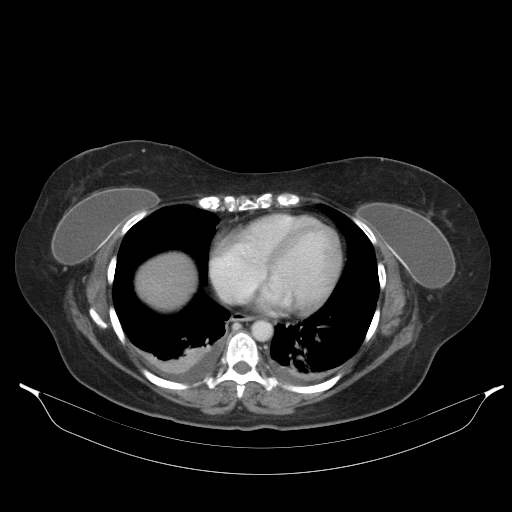
[im 93/100  lung]
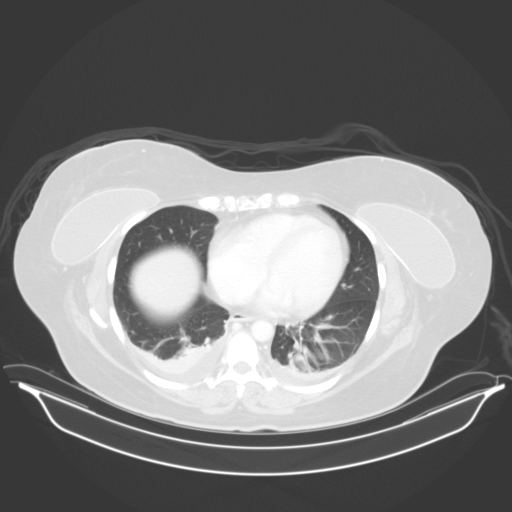

[14 of 32 positions shown; findings below may reference images not displayed]

FINDINGS: Lower chest: Small bilateral pleural effusions and bibasilar
atelectasis. No pericardial effusion. The heart is normal in size.
The distal esophagus is grossly normal.

Hepatobiliary: No focal hepatic lesions or intrahepatic biliary
dilatation. The gallbladder is normal. No common bile duct
dilatation.

Pancreas: No mass, inflammation or ductal dilatation.

Spleen: Normal size.  No focal lesions.

Adrenals/Urinary Tract: The adrenal glands and kidneys are normal.

Stomach/Bowel: The stomach, duodenum, small bowel and colon are
grossly normal. No inflammatory changes, mass lesions or obstructive
findings.

Vascular/Lymphatic: The aorta and branch vessels are patent. The
major venous structures are patent.

No mesenteric or retroperitoneal mass or adenopathy.

Reproductive: Status post hysterectomy.

Other: There is a Foley catheter in the bladder.

Moderate amount of free abdominal and pelvic fluid with layering
blood. Blush of contrast in the central pelvis may be active
extravasation of contrast/bleeding. (Series 2, image 80.

Musculoskeletal: No significant bony findings.
IMPRESSION: Moderate to large amount of fluid in the abdomen pelvis most of
which appears to be hemorrhage. Possible active extravasation/
bleeding in the central low pelvis.

Status post hysterectomy.

No significant findings in the abdomen.

These results were called by telephone at the time of interpretation
on 12/21/2015 at [DATE] to the patients nurse, Tony Sylvia Schembri Sheina-[place] , who
verbally acknowledged these results.

## 2018-06-11 ENCOUNTER — Ambulatory Visit: Payer: 59 | Admitting: Internal Medicine

## 2018-06-12 ENCOUNTER — Ambulatory Visit: Payer: Managed Care, Other (non HMO) | Admitting: Internal Medicine

## 2018-06-24 ENCOUNTER — Other Ambulatory Visit: Payer: Self-pay | Admitting: Internal Medicine

## 2018-07-02 ENCOUNTER — Ambulatory Visit: Payer: Managed Care, Other (non HMO) | Admitting: Internal Medicine

## 2018-09-30 ENCOUNTER — Encounter: Payer: Self-pay | Admitting: Internal Medicine

## 2018-09-30 ENCOUNTER — Other Ambulatory Visit: Payer: Self-pay

## 2018-09-30 ENCOUNTER — Ambulatory Visit (INDEPENDENT_AMBULATORY_CARE_PROVIDER_SITE_OTHER): Payer: Managed Care, Other (non HMO) | Admitting: Internal Medicine

## 2018-09-30 DIAGNOSIS — E038 Other specified hypothyroidism: Secondary | ICD-10-CM | POA: Diagnosis not present

## 2018-09-30 DIAGNOSIS — E559 Vitamin D deficiency, unspecified: Secondary | ICD-10-CM

## 2018-09-30 DIAGNOSIS — E040 Nontoxic diffuse goiter: Secondary | ICD-10-CM

## 2018-09-30 DIAGNOSIS — E063 Autoimmune thyroiditis: Secondary | ICD-10-CM | POA: Diagnosis not present

## 2018-09-30 MED ORDER — LEVOTHYROXINE SODIUM 112 MCG PO TABS: 112.0000 ug | ORAL_TABLET | Freq: Every day | ORAL | 3 refills | Status: DC

## 2018-09-30 MED ORDER — VITAMIN D (ERGOCALCIFEROL) 1.25 MG (50000 UNIT) PO CAPS: 50000.0000 [IU] | ORAL_CAPSULE | ORAL | 3 refills | Status: DC

## 2018-09-30 NOTE — Progress Notes (Signed)
Patient ID: Lavetta Nielsen, female   DOB: 1965-05-24, 53 y.o.   MRN: [id number]  Patient location: Home My location: Office  Referring Provider: self referred  I connected with the patient on 09/30/18 at  4:15 PM EDT by telephone and verified that I am speaking with the correct person.   I discussed the limitations of evaluation and management by telephone and the availability of in person appointments. The patient expressed understanding and agreed to proceed.   Details of the encounter are shown below.  HPI  KASHARI CHALMERS is a 53 y.o.-year-old female, presenting for follow-up for vitamin D deficiency and Hashimoto's hypothyroidism. Last visit 1 year and 3.5 months ago. She was previously followed by Dr. Ester Rink, endocrinologist at [facility].  Vitamin D deficiency  -Diagnosed in 2010  She was intermittently compliant with her daily vit D in the past >> at last visit, we changed to high dose weekly vitamin D (50,000 IU).  Vitamin D normalized on this dose despite occasional missed doses. She ran out >> now on OTC vitamin D 2000 IU - 3-4/7 days.  Previous vitamin D levels: Lab Results  Component Value Date   VD25OH 37.98 06/11/2017   VD25OH 33.02 07/16/2016   VD25OH 25.64 (L) 05/17/2016   VD25OH 26.80 (L) 08/16/2015  08/17/2013: Vitamin D 20.1 10/20/2012: Vitamin D 21.6 09/08/2012: Vitamin D 16.0 09/11/2011: Vitamin D 13.7 The records from Dr. Kalman Shan, patient had negative blood celiac workup in 08/2012.  Hypothyroidism - Diagnosed 2010 - Due to Hashimoto's thyroiditis  Pt is on levothyroxine 112 mcg daily (changed from Synthroid d.a.w. at last visit), taken: - in am - fasting - at least 30 min from b'fast - no Ca, Fe, MVI, PPIs - not on Biotin   Reviewed patient's TFTs: Lab Results  Component Value Date   TSH 2.17 06/11/2017   TSH 1.55 07/16/2016   TSH 0.09 (L) 05/17/2016   TSH 1.07 08/16/2015   FREET4 1.37 06/11/2017   FREET4 1.33 07/16/2016   FREET4  1.15 05/17/2016   FREET4 1.22 08/16/2015  - per records from Dr Kalman Shan: 08/17/2013: TSH 1.300 10/20/2012: TSH 0.637 09/08/2012: TSH 0.210 09/11/2011: TSH 0.945 03/05/2011: TSH 1.170 06/16/2010: TSH 1.640 06/14/2009: TPO antibodies 5451   She has + FH of thyroid disorders in: father and sister (hypothroidism). No FH of thyroid cancer. No h/o radiation tx to head or neck.  No herbal supplements. No Biotin use. No recent steroids use.   Non-nodular goiter. -The report of her thyroid ultrasound 10/13/2011: Right lobe: 5.6 x 2.0 x 1.9 cm  Left lobe: 5.0 x 1.7 x 1.6 cm.  Both lobes are relatively hypervascular and heterogeneous in appearance, similar to previous exam (2011). No new discrete nodule suggested.   Pt denies: - feeling nodules in neck - hoarseness - dysphagia - choking - SOB with lying down  She has a history of skin psoriasis. She was on a steroid cream, not recently.  ROS: Constitutional: no weight gain/no weight loss, no fatigue, no subjective hyperthermia, no subjective hypothermia Eyes: no blurry vision, no xerophthalmia ENT: no sore throat, + see HPI Cardiovascular: no CP/no SOB/no palpitations/no leg swelling Respiratory: no cough/no SOB/no wheezing Gastrointestinal: no N/no V/no D/no C/no acid reflux Musculoskeletal: no muscle aches/no joint aches Skin: + rash, no hair loss Neurological: no tremors/no numbness/no tingling/no dizziness  I reviewed pt's medications, allergies, PMH, social hx, family hx, and changes were documented in the history of present illness. Otherwise, unchanged from my initial visit note.  Past Medical History:  Diagnosis Date  . Complication of anesthesia    hard time waking up after anesthesia  . Goiter   . Hypothyroidism   . Psoriasis   . Thyroid disease    Hashimoto's thyroiditis  . Vitamin D deficiency    Past Surgical History:  Procedure Laterality Date  . ANTERIOR AND POSTERIOR REPAIR N/A 12/20/2015   Procedure: ANTERIOR  (CYSTOCELE) AND POSTERIOR REPAIR (RECTOCELE),PERITONEAL BIOPSY;  Surgeon: Bobbye Charleston, MD;  Location: [place] [facility];  Service: Gynecology;  Laterality: N/A;  . BLADDER SUSPENSION  12/20/2015   Procedure: TRANSVAGINAL TAPE (TVT) PROCEDURE;  Surgeon: Bobbye Charleston, MD;  Location: [place] [facility];  Service: Gynecology;;  . BREAST ENHANCEMENT SURGERY    . CYSTOSCOPY N/A 12/20/2015   Procedure: CYSTOSCOPY;  Surgeon: Bobbye Charleston, MD;  Location: [place] [facility];  Service: Gynecology;  Laterality: N/A;  . LAPAROSCOPY N/A 12/21/2015   Procedure: LAPAROSCOPY OPERATIVE, HEMASTASIS WITH ARISTA;  Surgeon: Bobbye Charleston, MD;  Location: [place] [facility];  Service: Gynecology;  Laterality: N/A;  . UNILATERAL SALPINGECTOMY Right 12/21/2015   Procedure: UNILATERAL SALPINGECTOMY;  Surgeon: Bobbye Charleston, MD;  Location: [facility];  Service: Gynecology;  Laterality: Right;  Marland Kitchen VAGINAL HYSTERECTOMY N/A 12/20/2015   Procedure: HYSTERECTOMY VAGINAL WITH RIGHT SALPINGECTOMY;  Surgeon: Bobbye Charleston, MD;  Location: [place] [facility];  Service: Gynecology;  Laterality: N/A;   Social History   Social History  . Marital Status: Married    Spouse Name: N/A  . Number of Children: 2   Occupational History  . homemaker   Social History Main Topics  . Smoking status: Former Research scientist (life sciences), quit in 2013  . Smokeless tobacco: Not on file  . Alcohol Use: Wine, liquor, 3-4x a week  . Drug Use: No   Current Outpatient Medications on File Prior to Visit  Medication Sig Dispense Refill  . clobetasol cream (TEMOVATE) 6.06 % Apply 1 application topically 2 (two) times daily.    Marland Kitchen levothyroxine (SYNTHROID) 112 MCG tablet TAKE 1 TABLET (112 MCG TOTAL) BY MOUTH DAILY BEFORE BREAKFAST 90 tablet 0  . Vitamin D, Ergocalciferol, (DRISDOL) 50000 units CAPS capsule Take 1 capsule (50,000 Units total) by mouth every 7 (seven) days. 10 capsule 5   No current facility-administered medications on file prior to visit.    Allergies  Allergen Reactions  . Tetracyclines & Related  Swelling  . Amoxicillin Rash    Has patient had a PCN reaction causing immediate rash, facial/tongue/throat swelling, SOB or lightheadedness with hypotension: No Has patient had a PCN reaction causing severe rash involving mucus membranes or skin necrosis: No Has patient had a PCN reaction that required hospitalization No Has patient had a PCN reaction occurring within the last 10 years: No If all of the above answers are "NO", then may proceed with Cephalosporin use.    Family History  Problem Relation Age of Onset  . Hypertension Mother   . Cancer Mother   . Diabetes Father   . Hypertension Father   . Thyroid disease Father   . Diabetes Sister   . Thyroid disease Sister   . Cancer Sister    PE: LMP 11/29/2012  Wt Readings from Last 3 Encounters:  06/11/17 155 lb (70.3 kg)  05/17/16 151 lb (68.5 kg)  12/20/15 159 lb (72.1 kg)   Constitutional:  in NAD  The physical exam was not performed (virtual visit).  ASSESSMENT: 1. Hashimoto's Hypothyroidism  2. Vit D deficiency  3. Goiter.   PLAN:  1. Patient with longstanding hypothyroidism, previously on Synthroid  d.a.w. treatment, but changed to generic levothyroxine due to price at last visit - latest thyroid labs reviewed with pt >> normal in 05/2017 - she continues on LT4 112 mcg daily - pt feels good on this dose, with no complaints. - we discussed about taking the thyroid hormone every day, with water, >30 minutes before breakfast, separated by >4 hours from acid reflux medications, calcium, iron, multivitamins. Pt. is taking it correctly. - will check thyroid tests when safe to return to the clinic: TSH and fT4 - If labs are abnormal, she will need to return for repeat TFTs in 1.5 months - Refilled levothyroxine for her today  - return to see me in a year  2.  Vit D deficiency -She has a history of vitamin D deficiency treated with ergocalciferol 50,000 units weekly -Latest vitamin D level was normal at last  visit -Since last visit, she ran out of her ergocalciferol and she was taking over-the-counter 2000 units vitamin D, but ~50% of the time.  I do not think this is enough for her and today I called in a new prescription for the 50,000 units ergocalciferol weekly -We will need to repeat the level as soon as safe to return to the clinic (now coronavirus pandemic)  3. Goiter -Nontoxic -Denies neck compression symptoms -Nonnodular per thyroid ultrasound from 2013 -No further repeat ultrasound is needed unless she develops neck compression symptoms.  - time spent with the patient: 11 min, of which >50% was spent in obtaining information about her symptoms, reviewing her previous labs, evaluations, and treatments, counseling her about her conditions (please see the discussed topics above), and developing a plan to further investigate and treat them.   Philemon Kingdom, MD PhD [facility] Endocrinology

## 2018-09-30 NOTE — Patient Instructions (Signed)
Please come back for labs in 1 to 2 months.  Please continue Levothyroxine 112 mcg daily.  Take the thyroid hormone every day, with water, at least 30 minutes before breakfast, separated by at least 4 hours from: - acid reflux medications - calcium - iron - multivitamins  Restart ergocalciferol 50,000 units weekly.  Please come back for a follow-up appointment in 1 year.

## 2019-04-07 ENCOUNTER — Other Ambulatory Visit: Payer: Self-pay

## 2019-04-07 DIAGNOSIS — Z20822 Contact with and (suspected) exposure to covid-19: Secondary | ICD-10-CM

## 2019-04-09 LAB — NOVEL CORONAVIRUS, NAA: SARS-CoV-2, NAA: NOT DETECTED

## 2019-10-16 ENCOUNTER — Other Ambulatory Visit: Payer: Self-pay | Admitting: Internal Medicine

## 2019-10-28 ENCOUNTER — Telehealth: Payer: Self-pay | Admitting: Internal Medicine

## 2019-10-28 NOTE — Telephone Encounter (Signed)
Medication Refill Request  Did you call your pharmacy and request this refill first? NO, advised   If patient has not contacted pharmacy first, instruct them to do so for future refills.   Remind them that contacting the pharmacy for their refill is the quickest method to get the refill.   Refill policy also stated that it will take anywhere between 24-72 hours to receive the refill.    Name of medication? LEvothyroxine  Is this a 90 day supply? YES  Name and location of pharmacy? [facility] in [place]

## 2019-10-28 NOTE — Telephone Encounter (Signed)
From your last office note:  Please come back for labs in 1 to 2 months; please follow up in 1 year  It does not appear pt had labs completed as requested. She does have a follow up appt scheduled in 2 days on 10/30/19. Uncertain if refill would be appropriate or if you would prefer labs be completed before refilling. Please advise.

## 2019-10-28 NOTE — Telephone Encounter (Signed)
Leah Lin, if she is not completely out, I would lprefer to wait until we get a new set of labs at the time of the visit. We may also send a supply to hold her over if she prefers. I am OK either way.

## 2019-10-29 NOTE — Telephone Encounter (Signed)
Would you please follow up with her

## 2019-10-30 ENCOUNTER — Other Ambulatory Visit: Payer: Self-pay

## 2019-10-30 ENCOUNTER — Encounter: Payer: Self-pay | Admitting: Internal Medicine

## 2019-10-30 ENCOUNTER — Ambulatory Visit (INDEPENDENT_AMBULATORY_CARE_PROVIDER_SITE_OTHER): Payer: Managed Care, Other (non HMO) | Admitting: Internal Medicine

## 2019-10-30 VITALS — BP 160/100 | HR 95 | Ht 63.25 in | Wt 154.0 lb

## 2019-10-30 DIAGNOSIS — E038 Other specified hypothyroidism: Secondary | ICD-10-CM

## 2019-10-30 DIAGNOSIS — E063 Autoimmune thyroiditis: Secondary | ICD-10-CM

## 2019-10-30 DIAGNOSIS — E559 Vitamin D deficiency, unspecified: Secondary | ICD-10-CM | POA: Diagnosis not present

## 2019-10-30 DIAGNOSIS — E040 Nontoxic diffuse goiter: Secondary | ICD-10-CM | POA: Diagnosis not present

## 2019-10-30 LAB — T4, FREE: Free T4: 0.83 ng/dL (ref 0.60–1.60)

## 2019-10-30 LAB — TSH: TSH: 5.94 u[IU]/mL — ABNORMAL HIGH (ref 0.35–4.50)

## 2019-10-30 MED ORDER — LEVOTHYROXINE SODIUM 125 MCG PO TABS: 125.0000 ug | ORAL_TABLET | Freq: Every day | ORAL | 3 refills | Status: DC

## 2019-10-30 NOTE — Progress Notes (Signed)
Patient ID: Leah Lin, female   DOB: 1965/05/30, 54 y.o.   MRN: [id number]  This visit occurred during the SARS-CoV-2 public health emergency.  Safety protocols were in place, including screening questions prior to the visit, additional usage of staff PPE, and extensive cleaning of exam room while observing appropriate contact time as indicated for disinfecting solutions.   HPI  Leah Lin is a 54 y.o.-year-old female, presenting for follow-up for vitamin D deficiency and Hashimoto's hypothyroidism. Last visit 1 year and 1 month ago (virtual). She was previously followed by Dr. Ester Rink, endocrinologist at [facility]  Vitamin D deficiency  -Diagnosed in 2010  She was intermittently compliant with her daily vit D in the past >> at last visit, we changed to high dose weekly vitamin D (50,000 IU).  Vitamin D normalized on this dose despite occasional missed doses.  However before last visit she was out of this and only on over-the-counter 2000 units daily but taking it approximately every other day as she was forgetting to take it.  We restarted the 50,000 units weekly formulation. She misses this occasionally (takes it ~ every other week, she thinks).  Previous vitamin D levels were reviewed: Lab Results  Component Value Date   VD25OH 37.98 06/11/2017   VD25OH 33.02 07/16/2016   VD25OH 25.64 (L) 05/17/2016   VD25OH 26.80 (L) 08/16/2015  08/17/2013: Vitamin D 20.1 10/20/2012: Vitamin D 21.6 09/08/2012: Vitamin D 16.0 09/11/2011: Vitamin D 13.7 The records from Dr. Kalman Shan, patient had negative blood celiac workup in 08/2012.  Hypothyroidism - Diagnosed in 2010 - 2/2 Hashimoto thyroiditis  She was previously on Synthroid d.a.w., currently on levothyroxine 112 mcg daily: - in am - fasting - at least 30 min from b'fast - no Ca, Fe, MVI, PPIs - not on Biotin  Reviewed patient's TFTs: Lab Results  Component Value Date   TSH 2.17 06/11/2017   TSH 1.55 07/16/2016   TSH  0.09 (L) 05/17/2016   TSH 1.07 08/16/2015   FREET4 1.37 06/11/2017   FREET4 1.33 07/16/2016   FREET4 1.15 05/17/2016   FREET4 1.22 08/16/2015  - per records from Dr Kalman Shan: 08/17/2013: TSH 1.300 10/20/2012: TSH 0.637 09/08/2012: TSH 0.210 09/11/2011: TSH 0.945 03/05/2011: TSH 1.170 06/16/2010: TSH 1.640 06/14/2009: TPO antibodies 5451   She has + FH of thyroid disorders in: father and sister (hypothroidism). No FH of thyroid cancer. No h/o radiation tx to head or neck.  No herbal supplements. No Biotin use. No recent steroids use.   Non-nodular goiter. Reviewed the report of her thyroid ultrasound 10/13/2011: Right lobe: 5.6 x 2.0 x 1.9 cm  Left lobe: 5.0 x 1.7 x 1.6 cm.  Both lobes are relatively hypervascular and heterogeneous in appearance, similar to previous exam (2011). No new discrete nodule suggested.   Pt denies: - feeling nodules in neck - hoarseness - dysphagia - choking - SOB with lying down  She has a history of skin psoriasis.  Previously on steroid creams.  ROS: Constitutional: no weight gain/no weight loss, no fatigue, no subjective hyperthermia, no subjective hypothermia Eyes: no blurry vision, no xerophthalmia ENT: no sore throat, + see HPI Cardiovascular: no CP/no SOB/no palpitations/no leg swelling Respiratory: no cough/no SOB/no wheezing Gastrointestinal: no N/no V/no D/no C/no acid reflux Musculoskeletal: no muscle aches/no joint aches Skin: no rashes, no hair loss Neurological: no tremors/no numbness/no tingling/no dizziness  I reviewed pt's medications, allergies, PMH, social hx, family hx, and changes were documented in the history of present illness. Otherwise,  unchanged from my initial visit note.  Past Medical History:  Diagnosis Date  . Complication of anesthesia    hard time waking up after anesthesia  . Goiter   . Hypothyroidism   . Psoriasis   . Thyroid disease    Hashimoto's thyroiditis  . Vitamin D deficiency    Past Surgical  History:  Procedure Laterality Date  . ANTERIOR AND POSTERIOR REPAIR N/A 12/20/2015   Procedure: ANTERIOR (CYSTOCELE) AND POSTERIOR REPAIR (RECTOCELE),PERITONEAL BIOPSY;  Surgeon: Bobbye Charleston, MD;  Location: [place];  Service: Gynecology;  Laterality: N/A;  . BLADDER SUSPENSION  12/20/2015   Procedure: TRANSVAGINAL TAPE (TVT) PROCEDURE;  Surgeon: Bobbye Charleston, MD;  Location: [place] [facility];  Service: Gynecology;;  . BREAST ENHANCEMENT SURGERY    . CYSTOSCOPY N/A 12/20/2015   Procedure: CYSTOSCOPY;  Surgeon: Bobbye Charleston, MD;  Location: [place];  Service: Gynecology;  Laterality: N/A;  . LAPAROSCOPY N/A 12/21/2015   Procedure: LAPAROSCOPY OPERATIVE, HEMASTASIS WITH ARISTA;  Surgeon: Bobbye Charleston, MD;  Location: [place] [facility];  Service: Gynecology;  Laterality: N/A;  . UNILATERAL SALPINGECTOMY Right 12/21/2015   Procedure: UNILATERAL SALPINGECTOMY;  Surgeon: Bobbye Charleston, MD;  Location: [place] [facility] [facility];  Service: Gynecology;  Laterality: Right;  Marland Kitchen VAGINAL HYSTERECTOMY N/A 12/20/2015   Procedure: HYSTERECTOMY VAGINAL WITH RIGHT SALPINGECTOMY;  Surgeon: Bobbye Charleston, MD;  Location: [facility];  Service: Gynecology;  Laterality: N/A;   Social History   Social History  . Marital Status: Married    Spouse Name: N/A  . Number of Children: 2   Occupational History  . homemaker   Social History Main Topics  . Smoking status: Former Research scientist (life sciences), quit in 2013  . Smokeless tobacco: Not on file  . Alcohol Use: Wine, liquor, 3-4x a week  . Drug Use: No   Current Outpatient Medications on File Prior to Visit  Medication Sig Dispense Refill  . clobetasol cream (TEMOVATE) 5.00 % Apply 1 application topically 2 (two) times daily.    Marland Kitchen levothyroxine (SYNTHROID) 112 MCG tablet Take 1 tablet (112 mcg total) by mouth daily. 90 tablet 3  . Vitamin D, Ergocalciferol, (DRISDOL) 1.25 MG (50000 UT) CAPS capsule Take 1 capsule (50,000 Units total) by mouth every 7 (seven) days. 12 capsule 3   No current facility-administered  medications on file prior to visit.   Allergies  Allergen Reactions  . Tetracyclines & Related Swelling  . Amoxicillin Rash    Has patient had a PCN reaction causing immediate rash, facial/tongue/throat swelling, SOB or lightheadedness with hypotension: No Has patient had a PCN reaction causing severe rash involving mucus membranes or skin necrosis: No Has patient had a PCN reaction that required hospitalization No Has patient had a PCN reaction occurring within the last 10 years: No If all of the above answers are "NO", then may proceed with Cephalosporin use.    Family History  Problem Relation Age of Onset  . Hypertension Mother   . Cancer Mother   . Diabetes Father   . Hypertension Father   . Thyroid disease Father   . Diabetes Sister   . Thyroid disease Sister   . Cancer Sister    PE: BP (!) 160/100   Pulse 95   Ht 5' 3.25" (1.607 m) Comment: measured today  Wt 154 lb (69.9 kg)   LMP 11/29/2012   SpO2 98%   BMI 27.06 kg/m  Wt Readings from Last 3 Encounters:  10/30/19 154 lb (69.9 kg)  06/11/17 155 lb (70.3 kg)  05/17/16 151 lb (68.5 kg)  Constitutional: Normal weight, in NAD Eyes: PERRLA, EOMI, no exophthalmos ENT: moist mucous membranes, no thyromegaly, no cervical lymphadenopathy Cardiovascular: RRR, No MRG Respiratory: CTA B Gastrointestinal: abdomen soft, NT, ND, BS+ Musculoskeletal: no deformities, strength intact in all 4 Skin: moist, warm, no rashes Neurological: no tremor with outstretched hands, DTR normal in all 4  ASSESSMENT: 1. Hashimoto's Hypothyroidism  2. Vit D deficiency  3. Goiter  PLAN:  1. Patient with longstanding hypothyroidism, previously on Synthroid d.a.w. treatment, but changed to generic levothyroxine due to price at last visit - latest thyroid labs reviewed with pt >> normal: Lab Results  Component Value Date   TSH 2.17 06/11/2017   - she continues on LT4 112 mcg daily-needs refills - pt feels good on this dose. - we  discussed about taking the thyroid hormone every day, with water, >30 minutes before breakfast, separated by >4 hours from acid reflux medications, calcium, iron, multivitamins. Pt. is taking it correctly. - will check thyroid tests today: TSH and fT4 - If labs are abnormal, she will need to return for repeat TFTs in 1.5 months  2.  Vit D deficiency -Latest level was normal -She has a history of vitamin D deficiency, treated with ergocalciferol 50,000 units weekly.  At last visit she was out of this and taking over-the-counter 2000 units vitamin D, but was forgetting it 50% of the time.  At that time, I called in a new prescription for the high-dose vitamin D -She takes this inconsistently, maybe once every other week -We will repeat another level today and she will need a refill of the ergocalciferol  3. Goiter -Nontoxic, nonnodular per thyroid ultrasound report from 2013 -She does not have neck compression symptoms -We do not need another ultrasound unless she starts developing neck compression symptoms  Blood pressure and pulse high today as she had to drive through a storm to get to the office.   NEEDS REFILLS LT4 and Ergocalciferol.  Component     Latest Ref Rng & Units 10/30/2019  TSH     0.35 - 4.50 uIU/mL 5.94 (H)  T4,Free(Direct)     0.60 - 1.60 ng/dL 0.83  TSH is slightly elevated.  Will increase the levothyroxine dose to 125 mcg daily and recheck her test in 1.5 months.  Component     Latest Ref Rng & Units 10/30/2019  Vitamin D, 25-Hydroxy     30.0 - 100.0 ng/mL 20.3 (L)  Vitamin D is low, as expected.  She will need to take the ergocalciferol weekly, as discussed.  We will recheck her vitamin D at next lab draw.  Philemon Kingdom, MD PhD [facility] Endocrinology

## 2019-10-30 NOTE — Patient Instructions (Signed)
Please stop at the lab.  Please continue Levothyroxine 112 mcg daily.  Take the thyroid hormone every day, with water, at least 30 minutes before breakfast, separated by at least 4 hours from: - acid reflux medications - calcium - iron - multivitamins  Continue ergocalciferol 50,000 units weekly.  Please come back for a follow-up appointment in 1 year.

## 2019-10-30 NOTE — Telephone Encounter (Signed)
Patient seen today

## 2019-10-31 LAB — VITAMIN D 25 HYDROXY (VIT D DEFICIENCY, FRACTURES): Vit D, 25-Hydroxy: 20.3 ng/mL — ABNORMAL LOW (ref 30.0–100.0)

## 2019-11-02 ENCOUNTER — Other Ambulatory Visit: Payer: Self-pay | Admitting: Internal Medicine

## 2019-11-02 DIAGNOSIS — E559 Vitamin D deficiency, unspecified: Secondary | ICD-10-CM

## 2020-04-23 ENCOUNTER — Other Ambulatory Visit: Payer: Self-pay | Admitting: Internal Medicine

## 2020-10-27 ENCOUNTER — Ambulatory Visit: Payer: Managed Care, Other (non HMO) | Admitting: Internal Medicine

## 2020-10-31 ENCOUNTER — Other Ambulatory Visit: Payer: Self-pay

## 2020-10-31 ENCOUNTER — Encounter: Payer: Self-pay | Admitting: Internal Medicine

## 2020-10-31 ENCOUNTER — Other Ambulatory Visit: Payer: Self-pay | Admitting: Internal Medicine

## 2020-10-31 ENCOUNTER — Ambulatory Visit (INDEPENDENT_AMBULATORY_CARE_PROVIDER_SITE_OTHER): Payer: Managed Care, Other (non HMO) | Admitting: Internal Medicine

## 2020-10-31 VITALS — BP 128/88 | HR 92 | Ht 63.25 in | Wt 145.4 lb

## 2020-10-31 DIAGNOSIS — E038 Other specified hypothyroidism: Secondary | ICD-10-CM

## 2020-10-31 DIAGNOSIS — E559 Vitamin D deficiency, unspecified: Secondary | ICD-10-CM | POA: Diagnosis not present

## 2020-10-31 DIAGNOSIS — E040 Nontoxic diffuse goiter: Secondary | ICD-10-CM

## 2020-10-31 DIAGNOSIS — E063 Autoimmune thyroiditis: Secondary | ICD-10-CM | POA: Diagnosis not present

## 2020-10-31 LAB — TSH: TSH: <0.01 u[IU]/mL — ABNORMAL LOW (ref 0.35–4.50)

## 2020-10-31 LAB — T4, FREE: Free T4: 1.72 ng/dL — ABNORMAL HIGH (ref 0.60–1.60)

## 2020-10-31 MED ORDER — LEVOTHYROXINE SODIUM 112 MCG PO TABS: 112.0000 ug | ORAL_TABLET | Freq: Every day | ORAL | 3 refills | Status: DC

## 2020-10-31 NOTE — Progress Notes (Addendum)
Patient ID: Leah Lin, female   DOB: 1965-08-31, 55 y.o.   MRN: [id number]  This visit occurred during the SARS-CoV-2 public health emergency.  Safety protocols were in place, including screening questions prior to the visit, additional usage of staff PPE, and extensive cleaning of exam room while observing appropriate contact time as indicated for disinfecting solutions.   HPI  Leah Lin is a 55 y.o.-year-old female, presenting for follow-up for vitamin D deficiency and Hashimoto's hypothyroidism. Last visit 1 year ago. She was previously followed by Dr. Ester Rink, endocrinologist at [facility].  Interim history: No complaints since last visit other than mild hair loss, not new. She lost 9 pounds since last visit, intentional.  Vitamin D deficiency  -Diagnosed in 2010  She was intermittently compliant with her daily vit D in the past >> at last visit, we changed to high dose weekly vitamin D (50,000 IU).  Vitamin D normalized on this dose despite occasional missed doses.  However, at last visit, she was again out and vitamin D was low.  We restarted it. She ran out few mo ago...  Previous vitamin D levels were reviewed: Lab Results  Component Value Date   VD25OH 20.3 (L) 10/30/2019   VD25OH 37.98 06/11/2017   VD25OH 33.02 07/16/2016   VD25OH 25.64 (L) 05/17/2016   VD25OH 26.80 (L) 08/16/2015  08/17/2013: Vitamin D 20.1 10/20/2012: Vitamin D 21.6 09/08/2012: Vitamin D 16.0 09/11/2011: Vitamin D 13.7 The records from Dr. Kalman Shan, patient had negative blood celiac workup in 08/2012.  Hypothyroidism - Diagnosed in 2010 - 2/2 Hashimoto thyroiditis  She was previously on LT4, currently on levothyroxine 125 mcg daily, increased at last visit: - in am - fasting - at least 30 min from b'fast - no Ca, Fe, MVI, PPIs - not on Biotin  Reviewed patient's TFTs -she did not present for labs after the increase in dose: Lab Results  Component Value Date   TSH 5.94 (H)  10/30/2019   TSH 2.17 06/11/2017   TSH 1.55 07/16/2016   TSH 0.09 (L) 05/17/2016   TSH 1.07 08/16/2015   FREET4 0.83 10/30/2019   FREET4 1.37 06/11/2017   FREET4 1.33 07/16/2016   FREET4 1.15 05/17/2016   FREET4 1.22 08/16/2015  - per records from Dr Kalman Shan: 08/17/2013: TSH 1.300 10/20/2012: TSH 0.637 09/08/2012: TSH 0.210 09/11/2011: TSH 0.945 03/05/2011: TSH 1.170 06/16/2010: TSH 1.640 06/14/2009: TPO antibodies 5451   She has + FH of thyroid disorders in: father and sister (hypothroidism). No FH of thyroid cancer. No h/o radiation tx to head or neck.  No herbal supplements. No Biotin use. No recent steroids use.   Non-nodular goiter. Reviewed the report of her thyroid ultrasound 10/13/2011: Right lobe: 5.6 x 2.0 x 1.9 cm  Left lobe: 5.0 x 1.7 x 1.6 cm.  Both lobes are relatively hypervascular and heterogeneous in appearance, similar to previous exam (2011). No new discrete nodule suggested.   Pt denies: - feeling nodules in neck - hoarseness - dysphagia - choking - SOB with lying down  She has a history of skin psoriasis.  Previously on steroid creams.  ROS: Constitutional: no weight gain/+ weight loss, no fatigue, no subjective hyperthermia, no subjective hypothermia Eyes: no blurry vision, no xerophthalmia ENT: no sore throat, + see HPI Cardiovascular: no CP/no SOB/no palpitations/no leg swelling Respiratory: no cough/no SOB/no wheezing Gastrointestinal: no N/no V/no D/no C/no acid reflux Musculoskeletal: no muscle aches/no joint aches Skin: no rashes, + hair loss Neurological: no tremors/no numbness/no tingling/no  dizziness  I reviewed pt's medications, allergies, PMH, social hx, family hx, and changes were documented in the history of present illness. Otherwise, unchanged from my initial visit note.  Past Medical History:  Diagnosis Date   Complication of anesthesia    hard time waking up after anesthesia   Goiter    Hypothyroidism    Psoriasis    Thyroid  disease    Hashimoto's thyroiditis   Vitamin D deficiency    Past Surgical History:  Procedure Laterality Date   ANTERIOR AND POSTERIOR REPAIR N/A 12/20/2015   Procedure: ANTERIOR (CYSTOCELE) AND POSTERIOR REPAIR (RECTOCELE),PERITONEAL BIOPSY;  Surgeon: Bobbye Charleston, MD;  Location: [facility];  Service: Gynecology;  Laterality: N/A;   BLADDER SUSPENSION  12/20/2015   Procedure: TRANSVAGINAL TAPE (TVT) PROCEDURE;  Surgeon: Bobbye Charleston, MD;  Location: [facility];  Service: Gynecology;;   BREAST ENHANCEMENT SURGERY     CYSTOSCOPY N/A 12/20/2015   Procedure: CYSTOSCOPY;  Surgeon: Bobbye Charleston, MD;  Location: [facility];  Service: Gynecology;  Laterality: N/A;   LAPAROSCOPY N/A 12/21/2015   Procedure: LAPAROSCOPY OPERATIVE, HEMASTASIS WITH ARISTA;  Surgeon: Bobbye Charleston, MD;  Location: [facility];  Service: Gynecology;  Laterality: N/A;   UNILATERAL SALPINGECTOMY Right 12/21/2015   Procedure: UNILATERAL SALPINGECTOMY;  Surgeon: Bobbye Charleston, MD;  Location: [facility];  Service: Gynecology;  Laterality: Right;   VAGINAL HYSTERECTOMY N/A 12/20/2015   Procedure: HYSTERECTOMY VAGINAL WITH RIGHT SALPINGECTOMY;  Surgeon: Bobbye Charleston, MD;  Location: [facility];  Service: Gynecology;  Laterality: N/A;   Social History   Social History   Marital Status: Married    Spouse Name: N/A   Number of Children: 2   Occupational History   homemaker   Social History Main Topics   Smoking status: Former Smoker, quit in 2013   Smokeless tobacco: Not on file   Alcohol Use: Wine, liquor, 3-4x a week   Drug Use: No   Current Outpatient Medications on File Prior to Visit  Medication Sig Dispense Refill   clobetasol cream (TEMOVATE) 4.23 % Apply 1 application topically 2 (two) times daily.     levothyroxine (SYNTHROID) 125 MCG tablet TAKE 1 TABLET BY MOUTH EVERY DAY 45 tablet 3   Vitamin D, Ergocalciferol, (DRISDOL) 1.25 MG (50000 UT) CAPS capsule Take 1 capsule (50,000 Units total) by mouth every 7 (seven) days. 12  capsule 3   No current facility-administered medications on file prior to visit.   Allergies  Allergen Reactions   Tetracyclines & Related Swelling   Amoxicillin Rash    Has patient had a PCN reaction causing immediate rash, facial/tongue/throat swelling, SOB or lightheadedness with hypotension: No Has patient had a PCN reaction causing severe rash involving mucus membranes or skin necrosis: No Has patient had a PCN reaction that required hospitalization No Has patient had a PCN reaction occurring within the last 10 years: No If all of the above answers are "NO", then may proceed with Cephalosporin use.    Family History  Problem Relation Age of Onset   Hypertension Mother    Cancer Mother    Diabetes Father    Hypertension Father    Thyroid disease Father    Diabetes Sister    Thyroid disease Sister    Cancer Sister    PE: BP 128/88 (BP Location: Right Arm, Patient Position: Sitting, Cuff Size: Normal)   Pulse 92   Ht 5' 3.25" (1.607 m)   Wt 145 lb 6.4 oz (66 kg)   LMP 11/29/2012   SpO2 98%  BMI 25.55 kg/m  Wt Readings from Last 3 Encounters:  10/31/20 145 lb 6.4 oz (66 kg)  10/30/19 154 lb (69.9 kg)  06/11/17 155 lb (70.3 kg)   Constitutional: Normal weight, in NAD Eyes: PERRLA, EOMI, no exophthalmos ENT: moist mucous membranes, no thyromegaly, no cervical lymphadenopathy Cardiovascular: RRR, No MRG Respiratory: CTA B Gastrointestinal: abdomen soft, NT, ND, BS+ Musculoskeletal: no deformities, strength intact in all 4 Skin: moist, warm, no rashes Neurological: no tremor with outstretched hands, DTR normal in all 4  ASSESSMENT: 1. Hashimoto's Hypothyroidism  2. Vit D deficiency  3. Goiter  PLAN:  1. Patient with longstanding hypothyroidism, previously on Synthroid d.a.w. but changed to generic due to price - latest thyroid labs reviewed with pt. >> TSH was elevated so we increased her LT4 dose.  She did not return for labs afterwards... Lab Results   Component Value Date   TSH 5.94 (H) 10/30/2019  - she continues on LT4 125 mcg daily - pt feels good on this dose. - we discussed about taking the thyroid hormone every day, with water, >30 minutes before breakfast, separated by >4 hours from acid reflux medications, calcium, iron, multivitamins. Pt. is taking it correctly. - will check thyroid tests today: TSH and fT4 - If labs are abnormal, she will need to return for repeat TFTs in 1.5 months  2.  Vit D deficiency -No joint/bone pains -At last visit, she was off her ergocalciferol 50,000 units weekly after she ran out and she was taking over-the-counter 2000 units vitamin D but she was forgetting it most of the time.  At that time, I called in a prescription for ergocalciferol for her. She ran out few mo ago... -Vitamin D level at last visit was low, at 20.3 -She did not come back for labs after restarting ergocalciferol. -We will repeat another level today -We discussed about the importance of taking the vitamin D consistently to avoid complication including bone and joint pains, depressed immune system, increased risk for diabetes  3. Goiter -Nontoxic, nonnodular per thyroid ultrasound report from 2013 -She denies neck compression symptoms -We do not need another ultrasound unless she starts developing neck compression symptoms  NEEDS REFILLS LT4 and Ergocalciferol.  Component     Latest Ref Rng & Units 10/31/2020  T4,Free(Direct)     0.60 - 1.60 ng/dL 1.72 (H)  TSH     0.35 - 4.50 uIU/mL <0.01 Repeated and verified X2. (L)  Suppressed TSH.  It is unusual that the TSH decreased this months after a small increase in dose... We will decrease the dose back to 112 mcg daily and recheck her test in 5 weeks.  Component     Latest Ref Rng & Units 10/31/2020  Vitamin D, 25-Hydroxy     30.0 - 100.0 ng/mL 24.7 (L)  Vitamin D low, as expected >> she will need to restart taking the vitamin D consistently.  We will recheck at next blood  draw.  Philemon Kingdom, MD PhD [facility] Endocrinology

## 2020-10-31 NOTE — Patient Instructions (Signed)
Please stop at the lab.  Please continue Levothyroxine 125 mcg daily.  Take the thyroid hormone every day, with water, at least 30 minutes before breakfast, separated by at least 4 hours from: - acid reflux medications - calcium - iron - multivitamins  Continue ergocalciferol 50,000 units weekly.  Please come back for a follow-up appointment in 1 year.

## 2020-11-01 LAB — VITAMIN D 25 HYDROXY (VIT D DEFICIENCY, FRACTURES): Vit D, 25-Hydroxy: 24.7 ng/mL — ABNORMAL LOW (ref 30.0–100.0)

## 2020-11-01 MED ORDER — VITAMIN D (ERGOCALCIFEROL) 1.25 MG (50000 UNIT) PO CAPS: 50000.0000 [IU] | ORAL_CAPSULE | ORAL | 3 refills | Status: DC

## 2020-11-01 NOTE — Addendum Note (Signed)
Addended by: Philemon Kingdom on: 11/01/2020 08:40 AM   Modules accepted: Orders

## 2021-05-08 ENCOUNTER — Other Ambulatory Visit: Payer: Self-pay | Admitting: Internal Medicine

## 2021-06-10 ENCOUNTER — Encounter (HOSPITAL_BASED_OUTPATIENT_CLINIC_OR_DEPARTMENT_OTHER): Payer: Self-pay | Admitting: Emergency Medicine

## 2021-06-10 ENCOUNTER — Emergency Department (HOSPITAL_BASED_OUTPATIENT_CLINIC_OR_DEPARTMENT_OTHER): Payer: Managed Care, Other (non HMO)

## 2021-06-10 ENCOUNTER — Other Ambulatory Visit: Payer: Self-pay

## 2021-06-10 ENCOUNTER — Emergency Department (HOSPITAL_BASED_OUTPATIENT_CLINIC_OR_DEPARTMENT_OTHER)
Admission: EM | Admit: 2021-06-10 | Discharge: 2021-06-10 | Disposition: A | Discharge status: Home / Self Care | Payer: Managed Care, Other (non HMO) | Attending: Emergency Medicine | Admitting: Emergency Medicine

## 2021-06-10 DIAGNOSIS — Z23 Encounter for immunization: Secondary | ICD-10-CM | POA: Diagnosis not present

## 2021-06-10 DIAGNOSIS — S0101XA Laceration without foreign body of scalp, initial encounter: Secondary | ICD-10-CM | POA: Diagnosis not present

## 2021-06-10 DIAGNOSIS — W19XXXA Unspecified fall, initial encounter: Secondary | ICD-10-CM

## 2021-06-10 DIAGNOSIS — S0990XA Unspecified injury of head, initial encounter: Secondary | ICD-10-CM | POA: Diagnosis present

## 2021-06-10 DIAGNOSIS — W01198A Fall on same level from slipping, tripping and stumbling with subsequent striking against other object, initial encounter: Secondary | ICD-10-CM | POA: Diagnosis not present

## 2021-06-10 MED ORDER — TETANUS-DIPHTH-ACELL PERTUSSIS 5-2.5-18.5 LF-MCG/0.5 IM SUSY
0.5000 mL | PREFILLED_SYRINGE | Freq: Once | INTRAMUSCULAR | Status: AC
Start: 2021-06-10 — End: 2021-06-10
  Administered 2021-06-10: 0.5 mL via INTRAMUSCULAR
  Filled 2021-06-10: qty 0.5

## 2021-06-10 MED ORDER — LIDOCAINE-EPINEPHRINE (PF) 2 %-1:200000 IJ SOLN
20.0000 mL | Freq: Once | INTRAMUSCULAR | Status: AC
  Administered 2021-06-10: 20 mL
  Filled 2021-06-10: qty 20
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
## 2021-06-10 NOTE — Discharge Instructions (Signed)
Keep wound clean and dry.  Follow-up with your doctor for staple removal in 7 days.  Return to the ED if develop new or worsening symptoms.

## 2021-10-27 ENCOUNTER — Ambulatory Visit: Payer: Managed Care, Other (non HMO) | Admitting: Internal Medicine

## 2021-10-27 NOTE — Progress Notes (Deleted)
Patient ID: Leah Lin, female   DOB: [birth year], 56 y.o.   MRN: [id number]  HPI  Leah Lin is a 56 y.o.-year-old female, presenting for follow-up for vitamin D deficiency and Hashimoto's hypothyroidism. Last visit 1 year ago. She was previously followed by Dr. Ester Rink, endocrinologist at [facility].  Interim history: No complaints since last visit other than mild hair loss, not new. Before last visit, she lost 9 pounds.  She  Vitamin D deficiency  -Diagnosed in 2010 -She is on ergocalciferol 50,000 units weekly.  However, at last visit, she was off after she ran out.  I advised her to restart it.  Previous vitamin D levels were reviewed: Lab Results  Component Value Date   VD25OH 24.7 (L) 10/31/2020   VD25OH 20.3 (L) 10/30/2019   VD25OH 37.98 06/11/2017   VD25OH 33.02 07/16/2016   VD25OH 25.64 (L) 05/17/2016   VD25OH 26.80 (L) 08/16/2015  08/17/2013: Vitamin D 20.1 10/20/2012: Vitamin D 21.6 09/08/2012: Vitamin D 16.0 09/11/2011: Vitamin D 13.7 The records from Dr. Kalman Shan, patient had negative blood celiac workup in 08/2012.  Hypothyroidism - Diagnosed in 2010 - 2/2 Hashimoto thyroiditis  At last visit, we decreased the levothyroxine dose from 125 mcg to 112 mcg daily.  She takes this: - in am - fasting - at least 30 min from b'fast - no Ca, Fe, MVI, PPIs - not on Biotin  Reviewed patient's TFTs -she did not come for labs after the decreasing dose: Lab Results  Component Value Date   TSH <0.01 Repeated and verified X2. (L) 10/31/2020   TSH 5.94 (H) 10/30/2019   TSH 2.17 06/11/2017   TSH 1.55 07/16/2016   TSH 0.09 (L) 05/17/2016   TSH 1.07 08/16/2015   FREET4 1.72 (H) 10/31/2020   FREET4 0.83 10/30/2019   FREET4 1.37 06/11/2017   FREET4 1.33 07/16/2016   FREET4 1.15 05/17/2016   FREET4 1.22 08/16/2015  - per records from Dr Kalman Shan: 08/17/2013: TSH 1.300 10/20/2012: TSH 0.637 09/08/2012: TSH 0.210 09/11/2011: TSH 0.945 03/05/2011: TSH  1.170 06/16/2010: TSH 1.640 06/14/2009: TPO antibodies 5451   She has + FH of thyroid disorders in: father and sister (hypothroidism). No FH of thyroid cancer. No h/o radiation tx to head or neck. No herbal supplements. No Biotin use. No recent steroids use.   Non-nodular goiter. Reviewed the report of her thyroid ultrasound 10/13/2011: Right lobe: 5.6 x 2.0 x 1.9 cm  Left lobe: 5.0 x 1.7 x 1.6 cm.  Both lobes are relatively hypervascular and heterogeneous in appearance, similar to previous exam (2011). No new discrete nodule suggested.   Pt denies: - feeling nodules in neck - hoarseness - dysphagia - choking - SOB with lying down  She has a history of skin psoriasis.  Previously on steroid creams.  ROS: + see HPI + hair loss  I reviewed pt's medications, allergies, PMH, social hx, family hx, and changes were documented in the history of present illness. Otherwise, unchanged from my initial visit note.  Past Medical History:  Diagnosis Date   Complication of anesthesia    hard time waking up after anesthesia   Goiter    Hypothyroidism    Psoriasis    Thyroid disease    Hashimoto's thyroiditis   Vitamin D deficiency    Past Surgical History:  Procedure Laterality Date   ANTERIOR AND POSTERIOR REPAIR N/A 12/20/2015   Procedure: ANTERIOR (CYSTOCELE) AND POSTERIOR REPAIR (RECTOCELE),PERITONEAL BIOPSY;  Surgeon: Bobbye Charleston, MD;  Location: [facility];  Service:  Gynecology;  Laterality: N/A;   BLADDER SUSPENSION  12/20/2015   Procedure: TRANSVAGINAL TAPE (TVT) PROCEDURE;  Surgeon: Bobbye Charleston, MD;  Location: [facility];  Service: Gynecology;;   BREAST ENHANCEMENT SURGERY     CYSTOSCOPY N/A 12/20/2015   Procedure: CYSTOSCOPY;  Surgeon: Bobbye Charleston, MD;  Location: [facility];  Service: Gynecology;  Laterality: N/A;   LAPAROSCOPY N/A 12/21/2015   Procedure: LAPAROSCOPY OPERATIVE, HEMASTASIS WITH ARISTA;  Surgeon: Bobbye Charleston, MD;  Location: [facility];  Service: Gynecology;   Laterality: N/A;   UNILATERAL SALPINGECTOMY Right 12/21/2015   Procedure: UNILATERAL SALPINGECTOMY;  Surgeon: Bobbye Charleston, MD;  Location: [facility];  Service: Gynecology;  Laterality: Right;   VAGINAL HYSTERECTOMY N/A 12/20/2015   Procedure: HYSTERECTOMY VAGINAL WITH RIGHT SALPINGECTOMY;  Surgeon: Bobbye Charleston, MD;  Location: [facility];  Service: Gynecology;  Laterality: N/A;   Social History   Social History   Marital Status: Married    Spouse Name: N/A   Number of Children: 2   Occupational History   homemaker   Social History Main Topics   Smoking status: Former Smoker, quit in 2013   Smokeless tobacco: Not on file   Alcohol Use: Wine, liquor, 3-4x a week   Drug Use: No   Current Outpatient Medications on File Prior to Visit  Medication Sig Dispense Refill   clobetasol cream (TEMOVATE) 9.62 % Apply 1 application topically 2 (two) times daily.     levothyroxine (SYNTHROID) 112 MCG tablet TAKE 1 TABLET BY MOUTH EVERY DAY 45 tablet 3   Vitamin D, Ergocalciferol, (DRISDOL) 1.25 MG (50000 UNIT) CAPS capsule Take 1 capsule (50,000 Units total) by mouth every 7 (seven) days. 12 capsule 3   No current facility-administered medications on file prior to visit.   Allergies  Allergen Reactions   Tetracyclines & Related Swelling   Amoxicillin Rash    Has patient had a PCN reaction causing immediate rash, facial/tongue/throat swelling, SOB or lightheadedness with hypotension: No Has patient had a PCN reaction causing severe rash involving mucus membranes or skin necrosis: No Has patient had a PCN reaction that required hospitalization No Has patient had a PCN reaction occurring within the last 10 years: No If all of the above answers are "NO", then may proceed with Cephalosporin use.    Family History  Problem Relation Age of Onset   Hypertension Mother    Cancer Mother    Diabetes Father    Hypertension Father    Thyroid disease Father    Diabetes Sister    Thyroid disease  Sister    Cancer Sister    PE: LMP 11/29/2012  Wt Readings from Last 3 Encounters:  06/10/21 140 lb (63.5 kg)  10/31/20 145 lb 6.4 oz (66 kg)  10/30/19 154 lb (69.9 kg)   Constitutional: normal, in NAD Eyes: EOMI, no exophthalmos ENT: moist mucous membranes, no masses palpated in neck, no cervical lymphadenopathy Cardiovascular: RRR, No MRG Respiratory: CTA B Musculoskeletal: no deformities Skin: moist, warm, no rashes Neurological: no tremor with outstretched hands  ASSESSMENT: 1. Hashimoto's Hypothyroidism  2. Vit D deficiency  3. Goiter  PLAN:  1. Patient with longstanding hypothyroidism, previously on Synthroid d.a.w., but changed to generic due to price - latest thyroid labs reviewed with pt. >> TSH was suppressed: Lab Results  Component Value Date   TSH <0.01 Repeated and verified X2. (L) 10/31/2020  - she continues on LT4 112 mcg daily, dose decreased after the above results returned.  However, she did not return for repeat  set of labs afterwards... - pt feels good on this dose. - we discussed about taking the thyroid hormone every day, with water, >30 minutes before breakfast, separated by >4 hours from acid reflux medications, calcium, iron, multivitamins. Pt. is taking it correctly. - will check thyroid tests today: TSH and fT4 - If labs are abnormal, she will need to return for repeat TFTs in 1.5 months -I will see her back in a year, but with labs at 6 months  2.  Vit D deficiency -She denies joint or bone pains -At last visit, she ran out of ergocalciferol -At last visit, vitamin D level was still low, at 24.7.  I advised her to restart ergocalciferol 50,000 units weekly -We will repeat another level today  3. Goiter -Nontoxic, nonnodular goiter, thyroid thyroid ultrasound from 2013 -No neck compression symptoms -No need to repeat her thyroid ultrasound unless she starts developing neck compression symptoms  Philemon Kingdom, MD PhD [facility]  Endocrinology

## 2021-11-11 ENCOUNTER — Other Ambulatory Visit: Payer: Self-pay | Admitting: Internal Medicine

## 2021-11-30 ENCOUNTER — Other Ambulatory Visit: Payer: Self-pay | Admitting: Internal Medicine

## 2021-12-26 ENCOUNTER — Other Ambulatory Visit: Payer: Self-pay | Admitting: Internal Medicine

## 2022-01-26 ENCOUNTER — Other Ambulatory Visit: Payer: Self-pay | Admitting: Internal Medicine

## 2022-01-29 ENCOUNTER — Telehealth: Payer: Self-pay | Admitting: Internal Medicine

## 2022-01-29 DIAGNOSIS — E038 Other specified hypothyroidism: Secondary | ICD-10-CM

## 2022-01-30 MED ORDER — LEVOTHYROXINE SODIUM 112 MCG PO TABS: 112.0000 ug | ORAL_TABLET | Freq: Every day | ORAL | 0 refills | Status: DC

## 2022-02-01 ENCOUNTER — Ambulatory Visit (INDEPENDENT_AMBULATORY_CARE_PROVIDER_SITE_OTHER): Payer: Managed Care, Other (non HMO) | Admitting: Internal Medicine

## 2022-02-01 ENCOUNTER — Encounter: Payer: Self-pay | Admitting: Internal Medicine

## 2022-02-01 VITALS — BP 126/74 | HR 92 | Ht 63.0 in | Wt 148.2 lb

## 2022-02-01 DIAGNOSIS — E040 Nontoxic diffuse goiter: Secondary | ICD-10-CM

## 2022-02-01 DIAGNOSIS — E038 Other specified hypothyroidism: Secondary | ICD-10-CM

## 2022-02-01 DIAGNOSIS — E063 Autoimmune thyroiditis: Secondary | ICD-10-CM

## 2022-02-01 DIAGNOSIS — E559 Vitamin D deficiency, unspecified: Secondary | ICD-10-CM | POA: Diagnosis not present

## 2022-02-01 MED ORDER — LEVOTHYROXINE SODIUM 112 MCG PO TABS: 112.0000 ug | ORAL_TABLET | Freq: Every day | ORAL | 0 refills | Status: DC

## 2022-02-01 NOTE — Progress Notes (Signed)
Patient ID: Leah Lin, female   DOB: Jun 15, 1965, 56 y.o.   MRN: [id number]  HPI  Leah Lin is a 56 y.o.-year-old female, presenting for follow-up for vitamin D deficiency and Hashimoto's hypothyroidism. Last visit 1 year and 3 months ago. She was previously followed by Dr. Ester Rink, endocrinologist at [facility].  Interim history: No complaints at today's visit. He is planning to go to the beach tomorrow.  Vitamin D deficiency  -Diagnosed in 2010  She was intermittently compliant with her daily vit D in the past >> at last visit, we changed to high dose weekly vitamin D (50,000 IU).  However she was not completely compliant with this.  Now ran out >> takes 2000 units D3 daily.  Previous vitamin D levels were reviewed: Lab Results  Component Value Date   VD25OH 24.7 (L) 10/31/2020   VD25OH 20.3 (L) 10/30/2019   VD25OH 37.98 06/11/2017   VD25OH 33.02 07/16/2016   VD25OH 25.64 (L) 05/17/2016   VD25OH 26.80 (L) 08/16/2015  08/17/2013: Vitamin D 20.1 10/20/2012: Vitamin D 21.6 09/08/2012: Vitamin D 16.0 09/11/2011: Vitamin D 13.7 The records from Dr. Kalman Shan, patient had negative blood celiac workup in 08/2012.  Hypothyroidism - Diagnosed in 2010 - 2/2 Hashimoto thyroiditis  She is on LT4 112 mcg daily, dose decreased at last visit: - off for 5 days after she ran out- just restarted this today - in am - fasting - at least 30 min from b'fast - no Ca, Fe, MVI, PPIs - not on Biotin  Reviewed patient's TFTs -she again did not come for labs after the change in dose at last OV: Lab Results  Component Value Date   TSH <0.01 Repeated and verified X2. (L) 10/31/2020   TSH 5.94 (H) 10/30/2019   TSH 2.17 06/11/2017   TSH 1.55 07/16/2016   TSH 0.09 (L) 05/17/2016   TSH 1.07 08/16/2015   FREET4 1.72 (H) 10/31/2020   FREET4 0.83 10/30/2019   FREET4 1.37 06/11/2017   FREET4 1.33 07/16/2016   FREET4 1.15 05/17/2016   FREET4 1.22 08/16/2015  - per records from Dr  Kalman Shan: 08/17/2013: TSH 1.300 10/20/2012: TSH 0.637 09/08/2012: TSH 0.210 09/11/2011: TSH 0.945 03/05/2011: TSH 1.170 06/16/2010: TSH 1.640 06/14/2009: TPO antibodies 5451   She has + FH of thyroid disorders in: father and sister (hypothroidism). No FH of thyroid cancer. No h/o radiation tx to head or neck. No herbal supplements. No Biotin use. No recent steroids use.   Non-nodular goiter. Reviewed the report of her thyroid ultrasound 10/13/2011: Right lobe: 5.6 x 2.0 x 1.9 cm  Left lobe: 5.0 x 1.7 x 1.6 cm.  Both lobes are relatively hypervascular and heterogeneous in appearance, similar to previous exam (2011). No new discrete nodule suggested.   Pt denies: - feeling nodules in neck - hoarseness - dysphagia - choking  She has a history of skin psoriasis.  Previously on steroid creams.  ROS: + see HPI  I reviewed pt's medications, allergies, PMH, social hx, family hx, and changes were documented in the history of present illness. Otherwise, unchanged from my initial visit note.  Past Medical History:  Diagnosis Date   Complication of anesthesia    hard time waking up after anesthesia   Goiter    Hypothyroidism    Psoriasis    Thyroid disease    Hashimoto's thyroiditis   Vitamin D deficiency    Past Surgical History:  Procedure Laterality Date   ANTERIOR AND POSTERIOR REPAIR N/A 12/20/2015  Procedure: ANTERIOR (CYSTOCELE) AND POSTERIOR REPAIR (RECTOCELE),PERITONEAL BIOPSY;  Surgeon: Bobbye Charleston, MD;  Location: [facility];  Service: Gynecology;  Laterality: N/A;   BLADDER SUSPENSION  12/20/2015   Procedure: TRANSVAGINAL TAPE (TVT) PROCEDURE;  Surgeon: Bobbye Charleston, MD;  Location: [facility];  Service: Gynecology;;   BREAST ENHANCEMENT SURGERY     CYSTOSCOPY N/A 12/20/2015   Procedure: CYSTOSCOPY;  Surgeon: Bobbye Charleston, MD;  Location: [facility];  Service: Gynecology;  Laterality: N/A;   LAPAROSCOPY N/A 12/21/2015   Procedure: LAPAROSCOPY OPERATIVE, HEMASTASIS WITH ARISTA;   Surgeon: Bobbye Charleston, MD;  Location: [facility];  Service: Gynecology;  Laterality: N/A;   UNILATERAL SALPINGECTOMY Right 12/21/2015   Procedure: UNILATERAL SALPINGECTOMY;  Surgeon: Bobbye Charleston, MD;  Location: [facility];  Service: Gynecology;  Laterality: Right;   VAGINAL HYSTERECTOMY N/A 12/20/2015   Procedure: HYSTERECTOMY VAGINAL WITH RIGHT SALPINGECTOMY;  Surgeon: Bobbye Charleston, MD;  Location: [facility];  Service: Gynecology;  Laterality: N/A;   Social History   Social History   Marital Status: Married    Spouse Name: N/A   Number of Children: 2   Occupational History   homemaker   Social History Main Topics   Smoking status: Former Smoker, quit in 2013   Smokeless tobacco: Not on file   Alcohol Use: Wine, liquor, 3-4x a week   Drug Use: No   Current Outpatient Medications on File Prior to Visit  Medication Sig Dispense Refill   clobetasol cream (TEMOVATE) 9.93 % Apply 1 application topically 2 (two) times daily.     levothyroxine (SYNTHROID) 112 MCG tablet Take 1 tablet (112 mcg total) by mouth daily. 30 tablet 0   Vitamin D, Ergocalciferol, (DRISDOL) 1.25 MG (50000 UNIT) CAPS capsule Take 1 capsule (50,000 Units total) by mouth every 7 (seven) days. 12 capsule 3   No current facility-administered medications on file prior to visit.   Allergies  Allergen Reactions   Tetracyclines & Related Swelling   Amoxicillin Rash    Has patient had a PCN reaction causing immediate rash, facial/tongue/throat swelling, SOB or lightheadedness with hypotension: No Has patient had a PCN reaction causing severe rash involving mucus membranes or skin necrosis: No Has patient had a PCN reaction that required hospitalization No Has patient had a PCN reaction occurring within the last 10 years: No If all of the above answers are "NO", then may proceed with Cephalosporin use.    Family History  Problem Relation Age of Onset   Hypertension Mother    Cancer Mother    Diabetes Father     Hypertension Father    Thyroid disease Father    Diabetes Sister    Thyroid disease Sister    Cancer Sister    PE: BP 126/74 (BP Location: Right Arm, Patient Position: Sitting, Cuff Size: Normal)   Pulse 92   Ht '5\' 3"'$  (1.6 m)   Wt 148 lb 3.2 oz (67.2 kg)   LMP 11/29/2012   SpO2 95%   BMI 26.25 kg/m  Wt Readings from Last 3 Encounters:  02/01/22 148 lb 3.2 oz (67.2 kg)  06/10/21 140 lb (63.5 kg)  10/31/20 145 lb 6.4 oz (66 kg)   Constitutional: normal weight, in NAD Eyes:  EOMI, no exophthalmos ENT: no neck masses, no cervical lymphadenopathy Cardiovascular: RRR, No MRG Respiratory: CTA B Musculoskeletal: no deformities Skin:no rashes Neurological: no tremor with outstretched hands  ASSESSMENT: 1. Hashimoto's Hypothyroidism  2. Vit D deficiency  3. Goiter  PLAN:  1. Patient with worsening hypothyroidism, previously on  Synthroid d.a.w. but changed to generic due to price - latest thyroid labs reviewed with pt. >> TSH suppressed: Lab Results  Component Value Date   TSH <0.01 Repeated and verified X2. (L) 10/31/2020  - she continues on LT4 112 mcg daily, dose decreased after the above results returned.  However, she did not return for labs afterwards... Also, she ran out of the prescription approximately 5 days ago.  She was able to refill it yesterday and she took a double dose yesterday and today. - pt feels good on this dose. - we discussed about taking the thyroid hormone every day, with water, >30 minutes before breakfast, separated by >4 hours from acid reflux medications, calcium, iron, multivitamins. Pt. is taking it correctly except for the missed doses. - will check thyroid tests in 1.5 mo: TSH and fT4 - OTW, I will see her back in 1 year  2.  Vit D deficiency -She denies joint or bone pain -She was forgetting the daily vitamin dose in the past she is now on weekly ergocalciferol 50,000 units -At last visit, vitamin D was low, 24.7 as she was missing vitamin D  doses -At that time, we discussed about the importance of taking the vitamin D consistently to avoid complication including bone and joint pains, depressed immune system, increased risk for diabetes this -At visit, she is out of ergocalciferol daily but she is taking 2000 units vitamin D daily.  For now, we will continue this.  3. Goiter -Nontoxic, nonnodular, but thyroid ultrasound report from 2013 -She denies neck compression symptoms -We will not need another ultrasound unless she starts developing neck compression symptoms or palpable masses in her neck  Orders Placed This Encounter  Procedures   TSH   T4, free   Vitamin D, 25-hydroxy   Philemon Kingdom, MD PhD [facility] Endocrinology

## 2022-02-01 NOTE — Patient Instructions (Addendum)
Please come back for labs in 5-6 weeks.  Please continue Levothyroxine 112 mcg daily.  Take the thyroid hormone every day, with water, at least 30 minutes before breakfast, separated by at least 4 hours from: - acid reflux medications - calcium - iron - multivitamins  Continue vitamin D 2000 units daily.  Please come back for a follow-up appointment in  6 months.

## 2022-02-19 NOTE — Telephone Encounter (Signed)
Error

## 2022-02-26 ENCOUNTER — Other Ambulatory Visit: Payer: Self-pay | Admitting: Internal Medicine

## 2022-02-26 DIAGNOSIS — E038 Other specified hypothyroidism: Secondary | ICD-10-CM

## 2022-03-08 ENCOUNTER — Other Ambulatory Visit (INDEPENDENT_AMBULATORY_CARE_PROVIDER_SITE_OTHER): Payer: Managed Care, Other (non HMO)

## 2022-03-08 DIAGNOSIS — E038 Other specified hypothyroidism: Secondary | ICD-10-CM

## 2022-03-08 DIAGNOSIS — E063 Autoimmune thyroiditis: Secondary | ICD-10-CM | POA: Diagnosis not present

## 2022-03-08 DIAGNOSIS — E559 Vitamin D deficiency, unspecified: Secondary | ICD-10-CM

## 2022-03-08 LAB — T4, FREE: Free T4: 1.3 ng/dL (ref 0.60–1.60)

## 2022-03-08 LAB — VITAMIN D 25 HYDROXY (VIT D DEFICIENCY, FRACTURES): VITD: 22.31 ng/mL — ABNORMAL LOW (ref 30.00–100.00)

## 2022-03-08 LAB — TSH: TSH: 0.26 u[IU]/mL — ABNORMAL LOW (ref 0.35–5.50)

## 2022-03-08 MED ORDER — LEVOTHYROXINE SODIUM 100 MCG PO TABS: 100.0000 ug | ORAL_TABLET | Freq: Every day | ORAL | 3 refills | Status: DC

## 2022-08-06 ENCOUNTER — Ambulatory Visit: Payer: Managed Care, Other (non HMO) | Admitting: Internal Medicine

## 2022-08-30 ENCOUNTER — Ambulatory Visit: Payer: Managed Care, Other (non HMO) | Admitting: Internal Medicine

## 2022-08-30 ENCOUNTER — Other Ambulatory Visit: Payer: Self-pay | Admitting: Internal Medicine

## 2022-08-30 NOTE — Progress Notes (Deleted)
Patient ID: Leah Lin, female   DOB: 01-Nov-1965, 57 y.o.   MRN: [id number]  HPI  Leah Lin is a 57 y.o.-year-old female, presenting for follow-up for vitamin D deficiency and Hashimoto's hypothyroidism. Last visit 7 months ago. She was previously followed by Dr. Nino Parsleyonald Rodrick Rose, endocrinologist [facility].  Interim history: No complaints at today's visit.  Vitamin D deficiency  -Diagnosed in 2010  She was intermittently compliant with her daily vit D in the past >> at last visit, we changed to high dose weekly vitamin D (50,000 IU).  However she was not completely compliant with this.  She then ran out and switched to 2000 units D3 daily. In 02/2022, I recommended to increase the dose to 4000 units daily  Previous vitamin D levels were reviewed: Lab Results  Component Value Date   VD25OH 22.31 (L) 03/08/2022   VD25OH 24.7 (L) 10/31/2020   VD25OH 20.3 (L) 10/30/2019   VD25OH 37.98 06/11/2017   VD25OH 33.02 07/16/2016   VD25OH 25.64 (L) 05/17/2016   VD25OH 26.80 (L) 08/16/2015  08/17/2013: Vitamin D 20.1 10/20/2012: Vitamin D 21.6 09/08/2012: Vitamin D 16.0 09/11/2011: Vitamin D 13.7 The records from Dr. Okey Dupreose, patient had negative blood celiac workup in 08/2012.  Hypothyroidism - Diagnosed in 2010 - 2/2 Hashimoto thyroiditis  She is on LT4 100 mcg daily, dose decreased 02/2022: - in am - fasting - at least 30 min from b'fast - no Ca, Fe, MVI, PPIs - not on Biotin  Reviewed patient's TFTs -she again did not come for labs after the change in dose at last OV: Lab Results  Component Value Date   TSH 0.26 (L) 03/08/2022   TSH <0.01 Repeated and verified X2. (L) 10/31/2020   TSH 5.94 (H) 10/30/2019   TSH 2.17 06/11/2017   TSH 1.55 07/16/2016   TSH 0.09 (L) 05/17/2016   TSH 1.07 08/16/2015   FREET4 1.30 03/08/2022   FREET4 1.72 (H) 10/31/2020   FREET4 0.83 10/30/2019   FREET4 1.37 06/11/2017   FREET4 1.33 07/16/2016   FREET4 1.15 05/17/2016   FREET4  1.22 08/16/2015  - per records from Dr Okey Dupreose: 08/17/2013: TSH 1.300 10/20/2012: TSH 0.637 09/08/2012: TSH 0.210 09/11/2011: TSH 0.945 03/05/2011: TSH 1.170 06/16/2010: TSH 1.640 06/14/2009: TPO antibodies 5451   She has + FH of thyroid disorders in: father and sister (hypothroidism). No FH of thyroid cancer. No h/o radiation tx to head or neck. No herbal supplements. No Biotin use. No recent steroids use.   Non-nodular goiter. Reviewed the report of her thyroid ultrasound 10/13/2011: Right lobe: 5.6 x 2.0 x 1.9 cm  Left lobe: 5.0 x 1.7 x 1.6 cm.  Both lobes are relatively hypervascular and heterogeneous in appearance, similar to previous exam (2011). No new discrete nodule suggested.   Pt denies: - feeling nodules in neck - hoarseness - dysphagia - choking  She has a history of skin psoriasis.  Previously on steroid creams.  ROS: + see HPI  I reviewed pt's medications, allergies, PMH, social hx, family hx, and changes were documented in the history of present illness. Otherwise, unchanged from my initial visit note.  Past Medical History:  Diagnosis Date   Complication of anesthesia    hard time waking up after anesthesia   Goiter    Hypothyroidism    Psoriasis    Thyroid disease    Hashimoto's thyroiditis   Vitamin D deficiency    Past Surgical History:  Procedure Laterality Date   ANTERIOR AND POSTERIOR REPAIR  N/A 12/20/2015   Procedure: ANTERIOR (CYSTOCELE) AND POSTERIOR REPAIR (RECTOCELE),PERITONEAL BIOPSY;  Surgeon: Carrington Clamp, MD;  Location: [facility];  Service: Gynecology;  Laterality: N/A;   BLADDER SUSPENSION  12/20/2015   Procedure: TRANSVAGINAL TAPE (TVT) PROCEDURE;  Surgeon: Carrington Clamp, MD;  Location: [facility];  Service: Gynecology;;   BREAST ENHANCEMENT SURGERY     CYSTOSCOPY N/A 12/20/2015   Procedure: CYSTOSCOPY;  Surgeon: Carrington Clamp, MD;  Location: [facility];  Service: Gynecology;  Laterality: N/A;   LAPAROSCOPY N/A 12/21/2015   Procedure:  LAPAROSCOPY OPERATIVE, HEMASTASIS WITH ARISTA;  Surgeon: Carrington Clamp, MD;  Location: [facility];  Service: Gynecology;  Laterality: N/A;   UNILATERAL SALPINGECTOMY Right 12/21/2015   Procedure: UNILATERAL SALPINGECTOMY;  Surgeon: Carrington Clamp, MD;  Location: [facility];  Service: Gynecology;  Laterality: Right;   VAGINAL HYSTERECTOMY N/A 12/20/2015   Procedure: HYSTERECTOMY VAGINAL WITH RIGHT SALPINGECTOMY;  Surgeon: Carrington Clamp, MD;  Location: [facility];  Service: Gynecology;  Laterality: N/A;   Social History   Social History   Marital Status: Married    Spouse Name: N/A   Number of Children: 2   Occupational History   homemaker   Social History Main Topics   Smoking status: Former Smoker, quit in 2013   Smokeless tobacco: Not on file   Alcohol Use: Wine, liquor, 3-4x a week   Drug Use: No   Current Outpatient Medications on File Prior to Visit  Medication Sig Dispense Refill   clobetasol cream (TEMOVATE) 0.05 % Apply 1 application topically 2 (two) times daily.     levothyroxine (SYNTHROID) 100 MCG tablet Take 1 tablet (100 mcg total) by mouth daily. 45 tablet 3   Vitamin D, Ergocalciferol, (DRISDOL) 1.25 MG (50000 UNIT) CAPS capsule Take 1 capsule (50,000 Units total) by mouth every 7 (seven) days. 12 capsule 3   No current facility-administered medications on file prior to visit.   Allergies  Allergen Reactions   Tetracyclines & Related Swelling   Amoxicillin Rash    Has patient had a PCN reaction causing immediate rash, facial/tongue/throat swelling, SOB or lightheadedness with hypotension: No Has patient had a PCN reaction causing severe rash involving mucus membranes or skin necrosis: No Has patient had a PCN reaction that required hospitalization No Has patient had a PCN reaction occurring within the last 10 years: No If all of the above answers are "NO", then may proceed with Cephalosporin use.    Metronidazole Diarrhea and Nausea Only   Family History  Problem  Relation Age of Onset   Hypertension Mother    Cancer Mother    Diabetes Father    Hypertension Father    Thyroid disease Father    Diabetes Sister    Thyroid disease Sister    Cancer Sister    PE: LMP 11/29/2012  Wt Readings from Last 3 Encounters:  02/01/22 148 lb 3.2 oz (67.2 kg)  06/10/21 140 lb (63.5 kg)  10/31/20 145 lb 6.4 oz (66 kg)   Constitutional: normal weight, in NAD Eyes:  EOMI, no exophthalmos ENT: no neck masses, no cervical lymphadenopathy Cardiovascular: RRR, No MRG Respiratory: CTA B Musculoskeletal: no deformities Skin:no rashes Neurological: no tremor with outstretched hands  ASSESSMENT: 1. Hashimoto's Hypothyroidism  2. Vit D deficiency  3. Goiter  PLAN:  1. Patient with uncontrolled hypothyroidism, previously on Synthroid d.a.w., but changed to generic levothyroxine due to price -Latest thyroid labs reviewed: TSH was suppressed: Lab Results  Component Value Date   TSH 0.26 (L) 03/08/2022  - she  continues on LT4 100 mcg daily -she did not come back for labs after the last dose change... - pt feels good on this dose. - we discussed about taking the thyroid hormone every day, with water, >30 minutes before breakfast, separated by >4 hours from acid reflux medications, calcium, iron, multivitamins. Pt. is taking it correctly. - will check thyroid tests today: TSH and fT4 - If labs are abnormal, she will need to return for repeat TFTs in 1.5 months - OTW, I will see her back in 6 months, rather than a year, due to noncompliance with testing recommendations  2.  Vit D deficiency -No muscle or joint aches -She was forgetting the daily vitamin dose in the past so she switched to weekly ergocalciferol 50,000 units -However, at last visit she was taking 2000 units vitamin D daily -Latest vitamin D level was slightly low so I advised her to increase the dose to 4000 units daily in 02/2022 -Will recheck the level today  3. Goiter -Nontoxic, nonnodular,  per thyroid ultrasound report from 2013 -Neck compression symptoms -No need to repeat the ultrasound unless she starts developing neck compression symptoms or palpable masses in her neck  Carlus Pavlov, MD PhD [facility] Endocrinology

## 2022-09-26 ENCOUNTER — Encounter: Payer: Self-pay | Admitting: Internal Medicine

## 2022-09-27 ENCOUNTER — Other Ambulatory Visit: Payer: Self-pay | Admitting: Internal Medicine

## 2022-09-28 ENCOUNTER — Ambulatory Visit: Payer: Managed Care, Other (non HMO) | Admitting: Internal Medicine

## 2022-10-04 ENCOUNTER — Other Ambulatory Visit (INDEPENDENT_AMBULATORY_CARE_PROVIDER_SITE_OTHER): Payer: Managed Care, Other (non HMO)

## 2022-10-04 DIAGNOSIS — E063 Autoimmune thyroiditis: Secondary | ICD-10-CM | POA: Diagnosis not present

## 2022-10-04 DIAGNOSIS — E559 Vitamin D deficiency, unspecified: Secondary | ICD-10-CM

## 2022-10-04 DIAGNOSIS — E038 Other specified hypothyroidism: Secondary | ICD-10-CM | POA: Diagnosis not present

## 2022-10-05 LAB — TSH: TSH: 0.24 u[IU]/mL — ABNORMAL LOW (ref 0.35–5.50)

## 2022-10-05 LAB — T4, FREE: Free T4: 1.18 ng/dL (ref 0.60–1.60)

## 2022-10-05 LAB — VITAMIN D 25 HYDROXY (VIT D DEFICIENCY, FRACTURES): VITD: 19.32 ng/mL — ABNORMAL LOW (ref 30.00–100.00)

## 2022-10-05 MED ORDER — LEVOTHYROXINE SODIUM 88 MCG PO TABS
88.0000 ug | ORAL_TABLET | Freq: Every day | ORAL | 3 refills | Status: DC
Start: 2022-10-05 — End: 2023-04-05

## 2023-02-07 ENCOUNTER — Ambulatory Visit: Payer: Managed Care, Other (non HMO) | Admitting: Internal Medicine

## 2023-03-01 ENCOUNTER — Ambulatory Visit: Payer: Managed Care, Other (non HMO) | Admitting: Internal Medicine

## 2023-03-01 NOTE — Progress Notes (Deleted)
Patient ID: Leah Lin, female   DOB: 06-Aug-1965, 57 y.o.   MRN: [id number]  HPI  Leah Lin is a 57 y.o.-year-old female, presenting for follow-up for vitamin D deficiency and Hashimoto's hypothyroidism. Last visit 1 year and 1 month ago. She was previously followed by Dr. Nino Parsley, endocrinologist at [facility].  Interim history: No complaints at today's visit.  Vitamin D deficiency  -Diagnosed in 2010  She was intermittently compliant with her daily vit D in the past >> at last visit, we changed to high dose weekly vitamin D (50,000 IU).  However she was not completely compliant with this.  Now ran out >> takes 2000 units D3 daily.  Previous vitamin D levels were reviewed: Lab Results  Component Value Date   VD25OH 19.32 (L) 10/04/2022   VD25OH 22.31 (L) 03/08/2022   VD25OH 24.7 (L) 10/31/2020   VD25OH 20.3 (L) 10/30/2019   VD25OH 37.98 06/11/2017   VD25OH 33.02 07/16/2016   VD25OH 25.64 (L) 05/17/2016   VD25OH 26.80 (L) 08/16/2015  08/17/2013: Vitamin D 20.1 10/20/2012: Vitamin D 21.6 09/08/2012: Vitamin D 16.0 09/11/2011: Vitamin D 13.7 The records from Dr. Okey Dupre, patient had negative blood celiac workup in 08/2012.  Hypothyroidism - Diagnosed in 2010 - 2/2 Hashimoto thyroiditis  She is on LT4 88 mcg daily: - Missing doses - in am - fasting - at least 30 min from b'fast - no Ca, Fe, MVI, PPIs - not on Biotin  Reviewed patient's TFTs: Lab Results  Component Value Date   TSH 0.24 (L) 10/04/2022   TSH 0.26 (L) 03/08/2022   TSH <0.01 Repeated and verified X2. (L) 10/31/2020   TSH 5.94 (H) 10/30/2019   TSH 2.17 06/11/2017   TSH 1.55 07/16/2016   TSH 0.09 (L) 05/17/2016   TSH 1.07 08/16/2015   FREET4 1.18 10/04/2022   FREET4 1.30 03/08/2022   FREET4 1.72 (H) 10/31/2020   FREET4 0.83 10/30/2019   FREET4 1.37 06/11/2017   FREET4 1.33 07/16/2016   FREET4 1.15 05/17/2016   FREET4 1.22 08/16/2015  - per records from Dr Okey Dupre: 08/17/2013: TSH  1.300 10/20/2012: TSH 0.637 09/08/2012: TSH 0.210 09/11/2011: TSH 0.945 03/05/2011: TSH 1.170 06/16/2010: TSH 1.640 06/14/2009: TPO antibodies 5451   She has + FH of thyroid disorders in: father and sister (hypothroidism). No FH of thyroid cancer. No h/o radiation tx to head or neck. No herbal supplements. No Biotin use. No recent steroids use.   Non-nodular goiter. Reviewed the report of her thyroid ultrasound 10/13/2011: Right lobe: 5.6 x 2.0 x 1.9 cm  Left lobe: 5.0 x 1.7 x 1.6 cm.  Both lobes are relatively hypervascular and heterogeneous in appearance, similar to previous exam (2011). No new discrete nodule suggested.   Pt denies: - feeling nodules in neck - hoarseness - dysphagia - choking  She has a history of skin psoriasis.  Previously on steroid creams.  ROS: + see HPI  I reviewed pt's medications, allergies, PMH, social hx, family hx, and changes were documented in the history of present illness. Otherwise, unchanged from my initial visit note.  Past Medical History:  Diagnosis Date   Complication of anesthesia    hard time waking up after anesthesia   Goiter    Hypothyroidism    Psoriasis    Thyroid disease    Hashimoto's thyroiditis   Vitamin D deficiency    Past Surgical History:  Procedure Laterality Date   ANTERIOR AND POSTERIOR REPAIR N/A 12/20/2015   Procedure: ANTERIOR (CYSTOCELE) AND POSTERIOR  REPAIR (RECTOCELE),PERITONEAL BIOPSY;  Surgeon: Carrington Clamp, MD;  Location: [facility];  Service: Gynecology;  Laterality: N/A;   BLADDER SUSPENSION  12/20/2015   Procedure: TRANSVAGINAL TAPE (TVT) PROCEDURE;  Surgeon: Carrington Clamp, MD;  Location: [facility];  Service: Gynecology;;   BREAST ENHANCEMENT SURGERY     CYSTOSCOPY N/A 12/20/2015   Procedure: CYSTOSCOPY;  Surgeon: Carrington Clamp, MD;  Location: [facility];  Service: Gynecology;  Laterality: N/A;   LAPAROSCOPY N/A 12/21/2015   Procedure: LAPAROSCOPY OPERATIVE, HEMASTASIS WITH ARISTA;  Surgeon: Carrington Clamp, MD;  Location: [facility];  Service: Gynecology;  Laterality: N/A;   UNILATERAL SALPINGECTOMY Right 12/21/2015   Procedure: UNILATERAL SALPINGECTOMY;  Surgeon: Carrington Clamp, MD;  Location: [facility];  Service: Gynecology;  Laterality: Right;   VAGINAL HYSTERECTOMY N/A 12/20/2015   Procedure: HYSTERECTOMY VAGINAL WITH RIGHT SALPINGECTOMY;  Surgeon: Carrington Clamp, MD;  Location: [facility];  Service: Gynecology;  Laterality: N/A;   Social History   Social History   Marital Status: Married    Spouse Name: N/A   Number of Children: 2   Occupational History   homemaker   Social History Main Topics   Smoking status: Former Smoker, quit in 2013   Smokeless tobacco: Not on file   Alcohol Use: Wine, liquor, 3-4x a week   Drug Use: No   Current Outpatient Medications on File Prior to Visit  Medication Sig Dispense Refill   clobetasol cream (TEMOVATE) 0.05 % Apply 1 application topically 2 (two) times daily.     levothyroxine (SYNTHROID) 88 MCG tablet Take 1 tablet (88 mcg total) by mouth daily. 45 tablet 3   Vitamin D, Ergocalciferol, (DRISDOL) 1.25 MG (50000 UNIT) CAPS capsule Take 1 capsule (50,000 Units total) by mouth every 7 (seven) days. 12 capsule 3   No current facility-administered medications on file prior to visit.   Allergies  Allergen Reactions   Tetracyclines & Related Swelling   Amoxicillin Rash    Has patient had a PCN reaction causing immediate rash, facial/tongue/throat swelling, SOB or lightheadedness with hypotension: No Has patient had a PCN reaction causing severe rash involving mucus membranes or skin necrosis: No Has patient had a PCN reaction that required hospitalization No Has patient had a PCN reaction occurring within the last 10 years: No If all of the above answers are "NO", then may proceed with Cephalosporin use.    Metronidazole Diarrhea and Nausea Only   Family History  Problem Relation Age of Onset   Hypertension Mother    Cancer Mother     Diabetes Father    Hypertension Father    Thyroid disease Father    Diabetes Sister    Thyroid disease Sister    Cancer Sister    PE: LMP 11/29/2012  Wt Readings from Last 3 Encounters:  02/01/22 148 lb 3.2 oz (67.2 kg)  06/10/21 140 lb (63.5 kg)  10/31/20 145 lb 6.4 oz (66 kg)   Constitutional: normal weight, in NAD Eyes:  EOMI, no exophthalmos ENT: no neck masses, no cervical lymphadenopathy Cardiovascular: RRR, No MRG Respiratory: CTA B Musculoskeletal: no deformities Skin:no rashes Neurological: no tremor with outstretched hands  ASSESSMENT: 1. Hashimoto's Hypothyroidism  2. Vit D deficiency  3. Goiter  PLAN:  1. Patient with worsening hypothyroidism, previously on Synthroid d.a.w. but changed to generic due to price - latest thyroid labs reviewed with pt. >> TSH suppressed: Lab Results  Component Value Date   TSH 0.24 (L) 10/04/2022  - she continues on LT4 88 mcg daily, dose  decreased after the above results returned, but she did not return for repeat labs... - we discussed that we need labs in 1.5 months after any change in dose - pt feels good on this dose. - we discussed about taking the thyroid hormone every day, with water, >30 minutes before breakfast, separated by >4 hours from acid reflux medications, calcium, iron, multivitamins. Pt. is taking it correctly, but still missing doses - will check thyroid tests today: TSH and fT4 - If labs are abnormal, she will need to return for repeat TFTs in 1.5 months - OTW, I will see her back in 1 year  2.  Vit D deficiency -no joint or bone pain -she was forgetting doses in the past >> switched to 50,000 units ergocalciferol weekly -At last visit, she was out of her ergocalciferol but taking 2000 units vitamin D daily.  We continued this. -Last vitamin D level was quite low: Lab Results  Component Value Date   VD25OH 19.32 (L) 10/04/2022  - we discussed about taking vitamin D consistently, to avoid complications  like bone and joint pains, abnormal immune system, increased risk for diabetes and cancer -Will recheck the level today  3. Goiter -Nontoxic, nonnodular, but thyroid ultrasound report from 2013 -No neck compression symptoms -Will not need another ultrasound unless she starts developing neck compression symptoms or a palpable mass in her neck  Carlus Pavlov, MD PhD [facility] Endocrinology

## 2023-04-05 ENCOUNTER — Other Ambulatory Visit: Payer: Self-pay | Admitting: Internal Medicine

## 2023-04-05 NOTE — Telephone Encounter (Signed)
Levothyroxine refill request complete

## 2023-10-12 ENCOUNTER — Other Ambulatory Visit: Payer: Self-pay | Admitting: Internal Medicine

## 2023-10-31 ENCOUNTER — Other Ambulatory Visit: Payer: Self-pay | Admitting: Internal Medicine

## 2023-10-31 ENCOUNTER — Telehealth: Payer: Self-pay | Admitting: Internal Medicine

## 2023-10-31 MED ORDER — LEVOTHYROXINE SODIUM 88 MCG PO TABS: 88.0000 ug | ORAL_TABLET | Freq: Every day | ORAL | 0 refills | Status: DC

## 2023-10-31 NOTE — Addendum Note (Signed)
 Addended by: Vernon Goodpasture on: 10/31/2023 12:37 PM   Modules accepted: Orders

## 2023-10-31 NOTE — Telephone Encounter (Signed)
 Message copied and paste from the refill request:J, I got a refill request for her levothyroxine .  I have not seen her in more than a year and would like to check labs first, before refilling this.  She has an appointment coming up on 1st of July.  I would not like to do labs before, without talking to her.  I plan to talk to her first and check the labs at the time of the visit.  I am not sure if she has enough levothyroxine  but if not, we can send a small levothyroxine  supply to last her until the time of the appointment. Ty! C

## 2023-10-31 NOTE — Telephone Encounter (Signed)
 Dr. Aldona Amel does not do labs prior to appts.

## 2023-10-31 NOTE — Telephone Encounter (Signed)
 LMTRC

## 2023-10-31 NOTE — Telephone Encounter (Signed)
 Patient has an appointment for follow up with Dr. Aldona Amel on 11/19/2023 and she wants to know if she can schedule a lab appointment prior to that appointment, so Dr. Aldona Amel will have the lab results at her 11/19/2023 appointment time.

## 2023-10-31 NOTE — Telephone Encounter (Signed)
 Requested Prescriptions   Signed Prescriptions Disp Refills   levothyroxine  (SYNTHROID ) 88 MCG tablet 19 tablet 0    Sig: Take 1 tablet (88 mcg total) by mouth daily.    Authorizing Provider: Emilie Harden    Ordering User: Hyla Maillard S   Pt called back and has been notified. She also states that she is out of medication so I sent in enough to last until her next appt on 11/19/23

## 2023-11-19 ENCOUNTER — Encounter: Payer: Self-pay | Admitting: Internal Medicine

## 2023-11-19 ENCOUNTER — Ambulatory Visit (INDEPENDENT_AMBULATORY_CARE_PROVIDER_SITE_OTHER): Admitting: Internal Medicine

## 2023-11-19 VITALS — BP 120/74 | HR 99 | Ht 63.0 in | Wt 138.6 lb

## 2023-11-19 DIAGNOSIS — E040 Nontoxic diffuse goiter: Secondary | ICD-10-CM | POA: Diagnosis not present

## 2023-11-19 DIAGNOSIS — E063 Autoimmune thyroiditis: Secondary | ICD-10-CM

## 2023-11-19 DIAGNOSIS — E559 Vitamin D deficiency, unspecified: Secondary | ICD-10-CM | POA: Diagnosis not present

## 2023-11-19 MED ORDER — VITAMIN D (ERGOCALCIFEROL) 1.25 MG (50000 UNIT) PO CAPS: 50000.0000 [IU] | ORAL_CAPSULE | ORAL | 1 refills | Status: DC

## 2023-11-19 NOTE — Patient Instructions (Addendum)
 Please stop at the lab.  Please continue Levothyroxine  88 mcg daily.  Take the thyroid  hormone every day, with water , at least 30 minutes before breakfast, separated by at least 4 hours from: - acid reflux medications - calcium - iron - multivitamins  Stop vitamin D  daily and start Ergocalciferol  50,000 units weekly.  Please come back for a follow-up appointment in 6 months.

## 2023-11-19 NOTE — Progress Notes (Signed)
 AtPatient ID: Leah Lin, female   DOB: 05/27/1965, 58 y.o.   MRN: [id number]  HPI  Leah Lin is a 58 y.o.-year-old female, presenting for follow-up for vitamin D  deficiency and Hashimoto's hypothyroidism. Last visit 1 year and 6 months ago.  She is not usually compliant with the recommended visit intervals. She was previously followed by Dr. Nancyann Hildreth Ee, endocrinologist at [facility].  Interim history: No complaints at today's visit other than worsening of her uterine prolapse, for which she previously had surgery approximately 7 years ago, may need to have a second surgery. She lost 10 pounds since last visit, unintentionally.  Vitamin D  deficiency  -Diagnosed in 2010  She was intermittently compliant with her daily vit D in the past >> at last visit, we changed to high dose weekly vitamin D  (50,000 IU).  However she was not completely compliant with this.  She ran out and started to take 2000 units vitamin D3 daily before last visit. She did not increase the dose to 4000 units as advised after last visit, as she forgot. She mentions that she only took vitamin D  consistently in last 1.5 weeks consistently.  Previous vitamin D  levels were reviewed: Lab Results  Component Value Date   VD25OH 19.32 (L) 10/04/2022   VD25OH 22.31 (L) 03/08/2022   VD25OH 24.7 (L) 10/31/2020   VD25OH 20.3 (L) 10/30/2019   VD25OH 37.98 06/11/2017   VD25OH 33.02 07/16/2016   VD25OH 25.64 (L) 05/17/2016   VD25OH 26.80 (L) 08/16/2015  08/17/2013: Vitamin D  20.1 10/20/2012: Vitamin D  21.6 09/08/2012: Vitamin D  16.0 09/11/2011: Vitamin D  13.7 The records from Dr. Ee, patient had negative blood celiac workup in 08/2012.  Hypothyroidism - Diagnosed in 2010 - 2/2 Hashimoto thyroiditis  She is on LT4 88 mcg daily, dose decreased 09/2022. - in am - fasting - at least 30 min from b'fast - no Ca, Fe, MVI, PPIs - not on Biotin  Reviewed patient's TFTs -she again did not come for labs after  the last change in dose: Lab Results  Component Value Date   TSH 0.24 (L) 10/04/2022   TSH 0.26 (L) 03/08/2022   TSH <0.01 Repeated and verified X2. (L) 10/31/2020   TSH 5.94 (H) 10/30/2019   TSH 2.17 06/11/2017   TSH 1.55 07/16/2016   TSH 0.09 (L) 05/17/2016   TSH 1.07 08/16/2015   FREET4 1.18 10/04/2022   FREET4 1.30 03/08/2022   FREET4 1.72 (H) 10/31/2020   FREET4 0.83 10/30/2019   FREET4 1.37 06/11/2017   FREET4 1.33 07/16/2016   FREET4 1.15 05/17/2016   FREET4 1.22 08/16/2015  - per records from Dr Ee: 08/17/2013: TSH 1.300 10/20/2012: TSH 0.637 09/08/2012: TSH 0.210 09/11/2011: TSH 0.945 03/05/2011: TSH 1.170 06/16/2010: TSH 1.640 06/14/2009: TPO antibodies 5451   She has + FH of thyroid  disorders in: father and sister (hypothroidism). No FH of thyroid  cancer. No h/o radiation tx to head or neck. No Biotin use. No recent steroids use.   Non-nodular goiter. Reviewed the report of her thyroid  ultrasound 10/13/2011: Right lobe: 5.6 x 2.0 x 1.9 cm  Left lobe: 5.0 x 1.7 x 1.6 cm.  Both lobes are relatively hypervascular and heterogeneous in appearance, similar to previous exam (2011). No new discrete nodule suggested.   Pt denies: - feeling nodules in neck - hoarseness - dysphagia - choking  She has a history of skin psoriasis.  Previously on steroid creams.  ROS: + see HPI  I reviewed pt's medications, allergies, PMH, social hx, family  hx, and changes were documented in the history of present illness. Otherwise, unchanged from my initial visit note.  Past Medical History:  Diagnosis Date   Complication of anesthesia    hard time waking up after anesthesia   Goiter    Hypothyroidism    Psoriasis    Thyroid  disease    Hashimoto's thyroiditis   Vitamin D  deficiency    Past Surgical History:  Procedure Laterality Date   ANTERIOR AND POSTERIOR REPAIR N/A 12/20/2015   Procedure: ANTERIOR (CYSTOCELE) AND POSTERIOR REPAIR (RECTOCELE),PERITONEAL BIOPSY;   Surgeon: Rosaline Luna, MD;  Location: [facility];  Service: Gynecology;  Laterality: N/A;   BLADDER SUSPENSION  12/20/2015   Procedure: TRANSVAGINAL TAPE (TVT) PROCEDURE;  Surgeon: Rosaline Luna, MD;  Location: [facility];  Service: Gynecology;;   BREAST ENHANCEMENT SURGERY     CYSTOSCOPY N/A 12/20/2015   Procedure: CYSTOSCOPY;  Surgeon: Rosaline Luna, MD;  Location: [facility];  Service: Gynecology;  Laterality: N/A;   LAPAROSCOPY N/A 12/21/2015   Procedure: LAPAROSCOPY OPERATIVE, HEMASTASIS WITH ARISTA;  Surgeon: Rosaline Luna, MD;  Location: [facility];  Service: Gynecology;  Laterality: N/A;   UNILATERAL SALPINGECTOMY Right 12/21/2015   Procedure: UNILATERAL SALPINGECTOMY;  Surgeon: Rosaline Luna, MD;  Location: [facility];  Service: Gynecology;  Laterality: Right;   VAGINAL HYSTERECTOMY N/A 12/20/2015   Procedure: HYSTERECTOMY VAGINAL WITH RIGHT SALPINGECTOMY;  Surgeon: Rosaline Luna, MD;  Location: [facility];  Service: Gynecology;  Laterality: N/A;   Social History   Social History   Marital Status: Married    Spouse Name: N/A   Number of Children: 2   Occupational History   homemaker   Social History Main Topics   Smoking status: Former Smoker, quit in 2013   Smokeless tobacco: Not on file   Alcohol Use: Wine, liquor, 3-4x a week   Drug Use: No   Current Outpatient Medications on File Prior to Visit  Medication Sig Dispense Refill   clobetasol cream (TEMOVATE) 0.05 % Apply 1 application topically 2 (two) times daily.     levothyroxine  (SYNTHROID ) 88 MCG tablet Take 1 tablet (88 mcg total) by mouth daily. 19 tablet 0   Vitamin D , Ergocalciferol , (DRISDOL ) 1.25 MG (50000 UNIT) CAPS capsule Take 1 capsule (50,000 Units total) by mouth every 7 (seven) days. 12 capsule 3   No current facility-administered medications on file prior to visit.   Allergies  Allergen Reactions   Tetracyclines & Related Swelling   Amoxicillin Rash    Has patient had a PCN reaction causing immediate rash,  facial/tongue/throat swelling, SOB or lightheadedness with hypotension: No Has patient had a PCN reaction causing severe rash involving mucus membranes or skin necrosis: No Has patient had a PCN reaction that required hospitalization No Has patient had a PCN reaction occurring within the last 10 years: No If all of the above answers are NO, then may proceed with Cephalosporin use.    Metronidazole  Diarrhea and Nausea Only   Family History  Problem Relation Age of Onset   Hypertension Mother    Cancer Mother    Diabetes Father    Hypertension Father    Thyroid  disease Father    Diabetes Sister    Thyroid  disease Sister    Cancer Sister    PE: BP 120/74   Pulse 99   Ht 5' 3 (1.6 m)   Wt 138 lb 9.6 oz (62.9 kg)   LMP 11/29/2012   SpO2 94%   BMI 24.55 kg/m  Wt Readings from Last 3 Encounters:  11/19/23 138 lb 9.6 oz (62.9 kg)  02/01/22 148 lb 3.2 oz (67.2 kg)  06/10/21 140 lb (63.5 kg)   Constitutional: normal weight, in NAD Eyes:  EOMI, no exophthalmos ENT: no neck masses, no cervical lymphadenopathy Cardiovascular: tachycardia, RR, No MRG Respiratory: CTA B Musculoskeletal: no deformities Skin:no rashes Neurological: no tremor with outstretched hands  ASSESSMENT: 1. Hashimoto's Hypothyroidism  2. Vit D deficiency  3. Goiter  PLAN:  1. Patient with history of hypothyroidism, previously on Synthroid  d.a.w. but changed to generic due to price.  She returns after another longer absence, of 1 year and 9 months. - she had instances in the past in which she did not return for labs after changing dose, also, running out of prescriptions.  From now on, I will have her come back on a 52-month basis. - latest thyroid  labs reviewed with pt. >> TSH was suppressed so LT4 was decreased but she did not return for repeat TFTs Lab Results  Component Value Date   TSH 0.24 (L) 10/04/2022  - she is on LT4 88 mcg daily - pt feels good on this dose. - we discussed about taking the  thyroid  hormone every day, with water , >30 minutes before breakfast, separated by >4 hours from acid reflux medications, calcium, iron, multivitamins. Pt. is taking it correctly. - will check thyroid  tests today: TSH and fT4 - If labs are abnormal, she will need to return for repeat TFTs in 1.5 months - OTW, I will see her back in 6 months  2.  Vit D deficiency - No joint or bone pain -She was previously forgetting to do daily vitamin D  dose and was switched to 50,000 units ergocalciferol  weekly.  At last visit, however, she was back to taking 2000 units vitamin D  daily.  We discussed about taking vitamin D  daily, and not missing doses, as her vitamin D  level was low at last check: Lab Results  Component Value Date   VD25OH 19.32 (L) 10/04/2022   VD25OH 22.31 (L) 03/08/2022   VD25OH 24.7 (L) 10/31/2020   VD25OH 20.3 (L) 10/30/2019   VD25OH 37.98 06/11/2017  - After last visit, I recommended to increase the dose of vitamin D  to 4000 units daily.  She she forgot and stayed with 2000 units but not taking consistently.  She mentions that she only took this daily for the last 1.5 weeks.  She mentions that she forgets to take it and would like to go back to the weekly formulation.  I sent a prescription for ergocalciferol  to her pharmacy. -will recheck the level at next OV  3. Goiter - Nontoxic, nonnodular per thyroid  ultrasound report from 2017 - No neck compression symptoms -We will not need another ultrasound unless she starts developing neck compression symptoms or palpable masses in her neck  Requested Prescriptions   Signed Prescriptions Disp Refills   Vitamin D , Ergocalciferol , (DRISDOL ) 1.25 MG (50000 UNIT) CAPS capsule 12 capsule 1    Sig: Take 1 capsule (50,000 Units total) by mouth every 7 (seven) days.    Orders Placed This Encounter  Procedures   TSH   T4, free   I did recommend to establish care with a PCP.  Needs LT4 refills.  Lela Fendt, MD PhD [facility]  Endocrinology

## 2023-11-20 ENCOUNTER — Ambulatory Visit: Payer: Self-pay | Admitting: Internal Medicine

## 2023-11-20 DIAGNOSIS — E063 Autoimmune thyroiditis: Secondary | ICD-10-CM

## 2023-11-20 LAB — T4, FREE: Free T4: 1.4 ng/dL (ref 0.8–1.8)

## 2023-11-20 LAB — TSH: TSH: 24.21 m[IU]/L — ABNORMAL HIGH (ref 0.40–4.50)

## 2023-11-20 MED ORDER — LEVOTHYROXINE SODIUM 100 MCG PO TABS: 100.0000 ug | ORAL_TABLET | Freq: Every day | ORAL | 4 refills | Status: DC

## 2023-11-20 NOTE — Addendum Note (Signed)
 Addended by: TRIXIE FILE on: 11/20/2023 12:44 PM   Modules accepted: Orders

## 2023-12-31 ENCOUNTER — Other Ambulatory Visit

## 2023-12-31 LAB — TSH: TSH: 24.56 m[IU]/L — ABNORMAL HIGH (ref 0.40–4.50)

## 2023-12-31 LAB — T4, FREE: Free T4: 1.7 ng/dL (ref 0.8–1.8)

## 2024-01-01 MED ORDER — LEVOTHYROXINE SODIUM 112 MCG PO TABS: 112.0000 ug | ORAL_TABLET | Freq: Every day | ORAL | 5 refills | Status: AC

## 2024-05-05 ENCOUNTER — Other Ambulatory Visit: Payer: Self-pay | Admitting: Internal Medicine

## 2024-05-22 ENCOUNTER — Ambulatory Visit: Admitting: Internal Medicine
